# Patient Record
Sex: Female | Born: 2000 | Hispanic: Yes | Marital: Single | Smoking: Never smoker
Health system: Southern US, Community
[De-identification: ages and names within clinical notes are randomized; demographics above are authoritative.]

## PROBLEM LIST (undated history)

## (undated) DIAGNOSIS — F988 Other specified behavioral and emotional disorders with onset usually occurring in childhood and adolescence: Secondary | ICD-10-CM

## (undated) HISTORY — DX: Other specified behavioral and emotional disorders with onset usually occurring in childhood and adolescence: F98.8

## (undated) HISTORY — PX: WISDOM TOOTH EXTRACTION: SHX21

---
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
## 2018-02-06 ENCOUNTER — Encounter: Payer: Self-pay | Admitting: Emergency Medicine

## 2018-02-06 ENCOUNTER — Emergency Department
Admission: EM | Admit: 2018-02-06 | Discharge: 2018-02-06 | Disposition: A | Discharge status: Home / Self Care | Payer: No Typology Code available for payment source | Attending: Emergency Medicine | Admitting: Emergency Medicine

## 2018-02-06 ENCOUNTER — Other Ambulatory Visit: Payer: Self-pay

## 2018-02-06 ENCOUNTER — Emergency Department: Payer: No Typology Code available for payment source

## 2018-02-06 DIAGNOSIS — Z79899 Other long term (current) drug therapy: Secondary | ICD-10-CM | POA: Diagnosis not present

## 2018-02-06 DIAGNOSIS — Y9389 Activity, other specified: Secondary | ICD-10-CM | POA: Diagnosis not present

## 2018-02-06 DIAGNOSIS — Y999 Unspecified external cause status: Secondary | ICD-10-CM | POA: Diagnosis not present

## 2018-02-06 DIAGNOSIS — Y92410 Unspecified street and highway as the place of occurrence of the external cause: Secondary | ICD-10-CM | POA: Diagnosis not present

## 2018-02-06 DIAGNOSIS — S0990XA Unspecified injury of head, initial encounter: Secondary | ICD-10-CM | POA: Diagnosis present

## 2018-02-06 DIAGNOSIS — M542 Cervicalgia: Secondary | ICD-10-CM | POA: Insufficient documentation

## 2018-02-06 MED ORDER — CYCLOBENZAPRINE HCL 5 MG PO TABS: ORAL_TABLET | ORAL | 0 refills | Status: DC

## 2018-02-06 MED ORDER — IBUPROFEN 600 MG PO TABS: 600.0000 mg | ORAL_TABLET | Freq: Four times a day (QID) | ORAL | 0 refills | Status: DC | PRN
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
## 2018-02-12 ENCOUNTER — Ambulatory Visit
Admission: RE | Admit: 2018-02-12 | Discharge: 2018-02-12 | Disposition: A | Discharge status: Home / Self Care | Payer: No Typology Code available for payment source | Source: Ambulatory Visit | Attending: Pediatrics | Admitting: Pediatrics

## 2018-02-12 ENCOUNTER — Ambulatory Visit
Admission: RE | Admit: 2018-02-12 | Discharge: 2018-02-12 | Disposition: A | Discharge status: Home / Self Care | Payer: No Typology Code available for payment source | Source: Ambulatory Visit | Attending: Internal Medicine | Admitting: Internal Medicine

## 2018-02-12 ENCOUNTER — Other Ambulatory Visit: Payer: Self-pay | Admitting: Pediatrics

## 2018-02-12 DIAGNOSIS — M545 Low back pain: Secondary | ICD-10-CM | POA: Insufficient documentation

## 2018-02-12 DIAGNOSIS — M4184 Other forms of scoliosis, thoracic region: Secondary | ICD-10-CM | POA: Diagnosis not present

## 2018-02-12 DIAGNOSIS — M4186 Other forms of scoliosis, lumbar region: Secondary | ICD-10-CM | POA: Diagnosis not present

## 2018-02-12 DIAGNOSIS — R202 Paresthesia of skin: Secondary | ICD-10-CM | POA: Diagnosis not present

## 2018-09-26 ENCOUNTER — Ambulatory Visit: Payer: Self-pay | Admitting: Nurse Practitioner

## 2018-09-26 ENCOUNTER — Encounter: Payer: Self-pay | Admitting: Nurse Practitioner

## 2018-09-26 ENCOUNTER — Other Ambulatory Visit: Payer: Self-pay

## 2018-09-26 DIAGNOSIS — F988 Other specified behavioral and emotional disorders with onset usually occurring in childhood and adolescence: Secondary | ICD-10-CM

## 2018-09-26 MED ORDER — AMPHETAMINE-DEXTROAMPHET ER 30 MG PO CP24: 30.0000 mg | ORAL_CAPSULE | Freq: Every day | ORAL | 0 refills | Status: DC

## 2018-09-26 NOTE — Progress Notes (Signed)
[facility] [address] [place], [place] [id number]  Internal MEDICINE  Office Visit Note  Patient Name: Summer PrairieRaiza E Elsberry  2000  [id number]  Date of Service: 10/02/2018   Complaints/HPI Pt is here for establishment of PCP. Chief Complaint  Patient presents with  . ADD    New patient establish care   . Cyst    on the back of head, just noticed it no pain or discomfort with it    The patient is here to establish new primary care provider. Up until now, she has been seeing pediatrician. She has no health problems or concerns. She does currently take Adderall XR 30mg  daily to help with focus and concentration. She has been on this for several years. She does well with this medication. She is currently in college and gets good grades. She needs to have a new prescription for this today.    Current Medication: Outpatient Encounter Medications as of 09/26/2018  Medication Sig  . amphetamine-dextroamphetamine (ADDERALL XR) 30 MG 24 hr capsule Take 1 capsule (30 mg total) by mouth daily.  . [DISCONTINUED] amphetamine-dextroamphetamine (ADDERALL XR) 30 MG 24 hr capsule Take 30 mg by mouth daily.  . [DISCONTINUED] cyclobenzaprine (FLEXERIL) 5 MG tablet Take 1-2 tablets 3 times daily as needed (Patient not taking: Reported on 09/26/2018)  . [DISCONTINUED] ibuprofen (ADVIL,MOTRIN) 600 MG tablet Take 1 tablet (600 mg total) by mouth every 6 (six) hours as needed. (Patient not taking: Reported on 09/26/2018)   No facility-administered encounter medications on file as of 09/26/2018.     Surgical History: History reviewed. No pertinent surgical history.  Medical History: Past Medical History:  Diagnosis Date  . ADD (attention deficit disorder)     Family History: Family History  Problem Relation Age of Onset  . Hypertension Mother     Social History   Socioeconomic History  . Marital status: Single    Spouse name: Not on file  . Number of children: Not on file  . Years  of education: Not on file  . Highest education level: Not on file  Occupational History  . Not on file  Social Needs  . Financial resource strain: Not on file  . Food insecurity:    Worry: Not on file    Inability: Not on file  . Transportation needs:    Medical: Not on file    Non-medical: Not on file  Tobacco Use  . Smoking status: Never Smoker  . Smokeless tobacco: Never Used  Substance and Sexual Activity  . Alcohol use: Never    Frequency: Never  . Drug use: Never  . Sexual activity: Not on file  Lifestyle  . Physical activity:    Days per week: Not on file    Minutes per session: Not on file  . Stress: Not on file  Relationships  . Social connections:    Talks on phone: Not on file    Gets together: Not on file    Attends religious service: Not on file    Active member of club or organization: Not on file    Attends meetings of clubs or organizations: Not on file    Relationship status: Not on file  . Intimate partner violence:    Fear of current or ex partner: Not on file    Emotionally abused: Not on file    Physically abused: Not on file    Forced sexual activity: Not on file  Other Topics Concern  . Not on file  Social History Narrative  . Not on file     Review of Systems  Constitutional: Negative for chills, fatigue and unexpected weight change.  HENT: Negative for congestion, postnasal drip, rhinorrhea, sneezing and sore throat.   Respiratory: Negative for cough, chest tightness and shortness of breath.   Cardiovascular: Negative for chest pain and palpitations.  Gastrointestinal: Negative for abdominal pain, constipation, diarrhea, nausea and vomiting.  Endocrine: Negative for cold intolerance, heat intolerance, polydipsia and polyuria.  Musculoskeletal: Negative for arthralgias, back pain, joint swelling and neck pain.  Skin: Negative for rash.  Allergic/Immunologic: Negative for environmental allergies.  Neurological: Negative for dizziness,  tremors, numbness and headaches.  Hematological: Negative for adenopathy. Does not bruise/bleed easily.  Psychiatric/Behavioral: Positive for decreased concentration. Negative for behavioral problems (Depression), sleep disturbance and suicidal ideas. The patient is not nervous/anxious.     Today's Vitals   09/26/18 1501  BP: 116/62  Pulse: 90  Resp: 16  SpO2: 98%  Weight: 179 lb (81.2 kg)  Height: 5\' 5"  (1.651 m)   Body mass index is 29.79 kg/m.   Physical Exam Vitals signs and nursing note reviewed.  Constitutional:      General: She is not in acute distress.    Appearance: Normal appearance. She is well-developed. She is not diaphoretic.  HENT:     Head: Normocephalic and atraumatic.     Mouth/Throat:     Pharynx: No oropharyngeal exudate.  Eyes:     Pupils: Pupils are equal, round, and reactive to light.  Neck:     Musculoskeletal: Normal range of motion and neck supple.     Thyroid: No thyromegaly.     Vascular: No JVD.     Trachea: No tracheal deviation.  Cardiovascular:     Rate and Rhythm: Normal rate and regular rhythm.     Heart sounds: Normal heart sounds. No murmur. No friction rub. No gallop.   Pulmonary:     Effort: Pulmonary effort is normal. No respiratory distress.     Breath sounds: Normal breath sounds. No wheezing or rales.  Chest:     Chest wall: No tenderness.  Abdominal:     General: Bowel sounds are normal.     Palpations: Abdomen is soft.  Musculoskeletal: Normal range of motion.  Lymphadenopathy:     Cervical: No cervical adenopathy.  Skin:    General: Skin is warm and dry.  Neurological:     Mental Status: She is alert and oriented to person, place, and time.     Cranial Nerves: No cranial nerve deficit.  Psychiatric:        Behavior: Behavior normal.        Thought Content: Thought content normal.        Judgment: Judgment normal.   Assessment/Plan: 1. Attention deficit disorder (ADD) without hyperactivity Single prescription for  adderall XR 30mg  capsules sent to her pharmacy. Will get medical records from pediatrician to review.   General Counseling: Tamari verbalizes understanding of the findings of todays visit and agrees with plan of treatment. I have discussed any further diagnostic evaluation that may be needed or ordered today. We also reviewed her medications today. she has been encouraged to call the office with any questions or concerns that should arise related to todays visit.    Counseling:  Refilled Controlled medications today. Reviewed risks and possible side effects associated with taking Stimulants. Combination of these drugs with other psychotropic medications could cause dizziness and drowsiness. Pt needs to Monitor symptoms and exercise  caution in driving and operating heavy machinery to avoid damages to oneself, to others and to the surroundings. Patient verbalized understanding in this matter. Dependence and abuse for these drugs will be monitored closely. A Controlled substance policy and procedure is on file which allows [facility] to order a urine drug screen test at any visit. Patient understands and agrees with the plan..  This patient was seen by Vincent Gros FNP Collaboration with Dr Lyndon Code as a part of collaborative care agreement  Meds ordered this encounter  Medications  . amphetamine-dextroamphetamine (ADDERALL XR) 30 MG 24 hr capsule    Sig: Take 1 capsule (30 mg total) by mouth daily.    Dispense:  30 capsule    Refill:  0    Order Specific Question:   Supervising Provider    Answer:   Lyndon Code [[id number]]    Time spent: 25 Minutes

## 2018-10-02 ENCOUNTER — Encounter: Payer: Self-pay | Admitting: Nurse Practitioner

## 2018-10-02 DIAGNOSIS — F988 Other specified behavioral and emotional disorders with onset usually occurring in childhood and adolescence: Secondary | ICD-10-CM | POA: Insufficient documentation

## 2018-10-17 ENCOUNTER — Emergency Department: Payer: HRSA Program

## 2018-10-17 ENCOUNTER — Other Ambulatory Visit: Payer: Self-pay

## 2018-10-17 ENCOUNTER — Emergency Department
Admission: EM | Admit: 2018-10-17 | Discharge: 2018-10-17 | Disposition: A | Discharge status: Home / Self Care | Payer: HRSA Program | Attending: Emergency Medicine | Admitting: Emergency Medicine

## 2018-10-17 ENCOUNTER — Encounter: Payer: Self-pay | Admitting: Intensive Care

## 2018-10-17 DIAGNOSIS — J189 Pneumonia, unspecified organism: Secondary | ICD-10-CM | POA: Diagnosis not present

## 2018-10-17 DIAGNOSIS — R509 Fever, unspecified: Secondary | ICD-10-CM | POA: Diagnosis present

## 2018-10-17 DIAGNOSIS — R0602 Shortness of breath: Secondary | ICD-10-CM

## 2018-10-17 DIAGNOSIS — Z20828 Contact with and (suspected) exposure to other viral communicable diseases: Secondary | ICD-10-CM | POA: Diagnosis not present

## 2018-10-17 DIAGNOSIS — Z79899 Other long term (current) drug therapy: Secondary | ICD-10-CM | POA: Diagnosis not present

## 2018-10-17 LAB — CBC
HCT: 38.3 % (ref 36.0–46.0)
Hemoglobin: 12.6 g/dL (ref 12.0–15.0)
MCH: 29.7 pg (ref 26.0–34.0)
MCHC: 32.9 g/dL (ref 30.0–36.0)
MCV: 90.3 fL (ref 80.0–100.0)
Platelets: 185 10*3/uL (ref 150–400)
RBC: 4.24 MIL/uL (ref 3.87–5.11)
RDW: 12.4 % (ref 11.5–15.5)
WBC: 5.4 10*3/uL (ref 4.0–10.5)
nRBC: 0 % (ref 0.0–0.2)

## 2018-10-17 LAB — BASIC METABOLIC PANEL
Anion gap: 8 (ref 5–15)
BUN: 8 mg/dL (ref 6–20)
CO2: 25 mmol/L (ref 22–32)
Calcium: 8.9 mg/dL (ref 8.9–10.3)
Chloride: 103 mmol/L (ref 98–111)
Creatinine, Ser: 0.54 mg/dL (ref 0.44–1.00)
GFR calc Af Amer: >60 mL/min (ref >60)
GFR calc non Af Amer: >60 mL/min (ref >60)
Glucose, Bld: 100 mg/dL — ABNORMAL HIGH (ref 70–99)
Potassium: 3.7 mmol/L (ref 3.5–5.1)
Sodium: 136 mmol/L (ref 135–145)

## 2018-10-17 LAB — CBC WITH DIFFERENTIAL/PLATELET
Abs Immature Granulocytes: 0.01 10*3/uL (ref 0.00–0.07)
Basophils Absolute: 0 10*3/uL (ref 0.0–0.1)
Basophils Relative: 0 %
Eosinophils Absolute: 0 10*3/uL (ref 0.0–0.5)
Eosinophils Relative: 0 %
HCT: 38.4 % (ref 36.0–46.0)
Hemoglobin: 12.7 g/dL (ref 12.0–15.0)
Immature Granulocytes: 0 %
Lymphocytes Relative: 26 %
Lymphs Abs: 1.4 10*3/uL (ref 0.7–4.0)
MCH: 30 pg (ref 26.0–34.0)
MCHC: 33.1 g/dL (ref 30.0–36.0)
MCV: 90.8 fL (ref 80.0–100.0)
Monocytes Absolute: 0.5 10*3/uL (ref 0.1–1.0)
Monocytes Relative: 10 %
Neutro Abs: 3.5 10*3/uL (ref 1.7–7.7)
Neutrophils Relative %: 64 %
Platelets: 189 10*3/uL (ref 150–400)
RBC: 4.23 MIL/uL (ref 3.87–5.11)
RDW: 12.4 % (ref 11.5–15.5)
WBC: 5.4 10*3/uL (ref 4.0–10.5)
nRBC: 0 % (ref 0.0–0.2)

## 2018-10-17 LAB — TROPONIN I (HIGH SENSITIVITY): Troponin I (High Sensitivity): <2 ng/L (ref <18)

## 2018-10-17 LAB — POCT PREGNANCY, URINE: Preg Test, Ur: NEGATIVE

## 2018-10-17 MED ORDER — ACETAMINOPHEN 325 MG PO TABS
650.0000 mg | ORAL_TABLET | Freq: Once | ORAL | Status: AC | PRN
  Administered 2018-10-17: 650 mg via ORAL
  Filled 2018-10-17: qty 2

## 2018-10-17 MED ORDER — AZITHROMYCIN 250 MG PO TABS: ORAL_TABLET | ORAL | 0 refills | Status: AC

## 2018-10-17 MED ORDER — AZITHROMYCIN 500 MG PO TABS
500.0000 mg | ORAL_TABLET | Freq: Once | ORAL | Status: AC
  Administered 2018-10-17: 500 mg via ORAL
  Filled 2018-10-17: qty 1

## 2018-10-17 NOTE — ED Triage Notes (Signed)
PAtient c/o sob, cough, fever, and fatigue. Patient crying during triage. Fever 102.1 upon arrival

## 2018-10-17 NOTE — ED Provider Notes (Signed)
[facility] Emergency Department Provider Note   ____________________________________________   First MD Initiated Contact with Patient 10/17/18 2014     (approximate)  I have reviewed the triage vital signs and the nursing notes.   HISTORY  Chief Complaint Fever, Fatigue, Cough, and Shortness of Breath    HPI Summer Webb is a 18 y.o. female complains of cough fever feeling ill.  1 of her family members has been tested positive for COVID.  Her cough is nonproductive.  Fever 102 here.  She does not have any sore throat belly pain dysuria or other complaints.       Past Medical History:  Diagnosis Date  . ADD (attention deficit disorder)     Patient Active Problem List   Diagnosis Date Noted  . ADD (attention deficit disorder) 10/02/2018  . Cardiac murmur 08/23/2016  . Fibrocystic breast changes 12/13/2012    History reviewed. No pertinent surgical history.  Prior to Admission medications   Medication Sig Start Date End Date Taking? Authorizing Provider  amphetamine-dextroamphetamine (ADDERALL XR) 30 MG 24 hr capsule Take 1 capsule (30 mg total) by mouth daily. 09/26/18   Ronnell Freshwater, NP  azithromycin (ZITHROMAX Z-PAK) 250 MG tablet Take 2 tablets (500 mg) on  Day 1,  followed by 1 tablet (250 mg) once daily on Days 2 through 5. 10/17/18 10/22/18  Nena Polio, MD    Allergies Patient has no known allergies.  Family History  Problem Relation Age of Onset  . Hypertension Mother     Social History Social History   Tobacco Use  . Smoking status: Never Smoker  . Smokeless tobacco: Never Used  Substance Use Topics  . Alcohol use: Never    Frequency: Never  . Drug use: Never    Review of Systems  Constitutional:  fever/chills Eyes: No visual changes. ENT: No sore throat. Cardiovascular: Denies chest pain. Respiratory: Denies shortness of breath. Gastrointestinal: No abdominal pain.  No nausea, no vomiting.  No diarrhea.   No constipation. Genitourinary: Negative for dysuria. Musculoskeletal: Negative for back pain. Skin: Negative for rash. Neurological: Negative for headaches, focal weakness   ____________________________________________   PHYSICAL EXAM:  VITAL SIGNS: ED Triage Vitals  Enc Vitals Group     BP 10/17/18 1803 (!) 149/92     Pulse Rate 10/17/18 1803 100     Resp 10/17/18 1803 20     Temp 10/17/18 1803 (!) 102.1 F (38.9 C)     Temp Source 10/17/18 1803 Oral     SpO2 10/17/18 1803 98 %     Weight 10/17/18 1804 174 lb (78.9 kg)     Height 10/17/18 1804 5\' 5"  (1.651 m)     Head Circumference --      Peak Flow --      Pain Score 10/17/18 1804 7     Pain Loc --      Pain Edu? --      Excl. in [place]? --    Constitutional: Alert and oriented. Well appearing and in no acute distress. Eyes: Conjunctivae are normal.  Head: Atraumatic. Nose: No congestion/rhinnorhea. Mouth/Throat: Mucous membranes are moist.  Oropharynx non-erythematous. Neck: No stridor.  Cardiovascular: Normal rate, regular rhythm. Grossly normal heart sounds.  Good peripheral circulation. Respiratory: Normal respiratory effort.  No retractions. Lungs CTAB.  Patient is coughing a lot.  The cough is dry. Gastrointestinal: Soft and nontender. No distention. No abdominal bruits.  Musculoskeletal: No lower extremity tenderness nor edema.  Neurologic:  Normal speech and language. No gross focal neurologic deficits are appreciated. . Skin:  Skin is warm, dry and intact. No rash noted.  ____________________________________________   LABS (all labs ordered are listed, but only abnormal results are displayed)  Labs Reviewed  BASIC METABOLIC PANEL - Abnormal; Notable for the following components:      Result Value   Glucose, Bld 100 (*)    All other components within normal limits  NOVEL CORONAVIRUS, NAA (HOSPITAL ORDER, SEND-OUT TO REF LAB)  CBC  TROPONIN I (HIGH SENSITIVITY)  CBC WITH DIFFERENTIAL/PLATELET  POC  URINE PREG, ED  POCT PREGNANCY, URINE   ____________________________________________  EKG  KG read interpreted by me shows sinus tachycardia rate of 112 normal axis nonspecific ST-T wave changes ____________________________________________  RADIOLOGY  ED MD interpretation  Official radiology report(s): Dg Chest 1 View  Result Date: 10/17/2018 CLINICAL DATA:  Shortness of breath, cough and fever EXAM: CHEST  1 VIEW COMPARISON:  None. FINDINGS: There is opacity within the lower right lung likely indicating early consolidation. No pleural effusion or pneumothorax. Left lung is clear. Normal cardiomediastinal contours. IMPRESSION: Lower right lung opacity, likely pneumonia. Electronically Signed   By: Deatra RobinsonKevin  Herman M.D.   On: 10/17/2018 20:53    ____________________________________________   PROCEDURES  Procedure(s) performed (including Critical Care):  Procedures   ____________________________________________   INITIAL IMPRESSION / ASSESSMENT AND PLAN / ED COURSE  Coronavirus test is negative.  The chest x-ray does show a right lower lobe hazy area consistent with pneumonia.  The patient is not hypoxic.  White count is okay.  We will get her some Zithromax and have her return for any further problems.              ____________________________________________   FINAL CLINICAL IMPRESSION(S) / ED DIAGNOSES  Final diagnoses:  Community acquired pneumonia, unspecified laterality     ED Discharge Orders         Ordered    azithromycin (ZITHROMAX Z-PAK) 250 MG tablet     10/17/18 2144           Note:  This document was prepared using Dragon voice recognition software and may include unintentional dictation errors.    Arnaldo NatalMalinda, Kimara Bencomo F, MD 10/17/18 2145

## 2018-10-17 NOTE — ED Notes (Signed)
Esign not working at this time. Pt verbalized discharge instructions and has no questions at this time. 

## 2018-10-17 NOTE — Discharge Instructions (Addendum)
Chest x-ray shows a pneumonia.  Your coronavirus test is negative.  He might have just a regular pneumonia.  I will give you some Zithromax antibiotic.  We will give you a dose here tonight and then another prescription to start tomorrow.  Can start a tomorrow afternoon that will be fine.  Please return here if you get increasingly short of breath feel sicker or have any other problems.

## 2018-10-19 LAB — NOVEL CORONAVIRUS, NAA (HOSP ORDER, SEND-OUT TO REF LAB; TAT 18-24 HRS): SARS-CoV-2, NAA: NOT DETECTED

## 2018-10-23 ENCOUNTER — Telehealth: Payer: Self-pay | Admitting: Emergency Medicine

## 2018-10-23 NOTE — Telephone Encounter (Signed)
Called patient to inform of negative covid 19 test with [facility] interpretter.  No answer and voicemail is full.

## 2018-10-28 ENCOUNTER — Ambulatory Visit: Payer: Medicaid Other | Admitting: Nurse Practitioner

## 2018-11-20 ENCOUNTER — Ambulatory Visit: Payer: Self-pay | Admitting: Adult Health

## 2018-11-20 ENCOUNTER — Encounter: Payer: Self-pay | Admitting: Adult Health

## 2018-11-20 ENCOUNTER — Other Ambulatory Visit: Payer: Self-pay

## 2018-11-20 VITALS — Temp 98.6°F

## 2018-11-20 DIAGNOSIS — F988 Other specified behavioral and emotional disorders with onset usually occurring in childhood and adolescence: Secondary | ICD-10-CM

## 2018-11-20 DIAGNOSIS — R059 Cough, unspecified: Secondary | ICD-10-CM

## 2018-11-20 DIAGNOSIS — R05 Cough: Secondary | ICD-10-CM

## 2018-11-20 NOTE — Progress Notes (Signed)
[facility] [address] [place], [place] [address]  Internal MEDICINE  Telephone Visit  Patient Name: Summer Webb  [birth year]  [id number]  Date of Service: 11/20/2018  I connected with the patient at  1205 by telephone and verified the patients identity using two identifiers.  I discussed the limitations, risks, security and privacy concerns of performing an evaluation and management service by telephone and the availability of in person appointments. I also discussed with the patient that there may be a patient responsible charge related to the service.  The patient expressed understanding and agrees to proceed.    Chief Complaint  Patient presents with  . Telephone Screen  . Pneumonia  . Cough    coughing up clear phlegm, pt states at times she feels like she can not catch her breathe   . Telephone Assessment    HPI  PT reports she was diagnosed with pneumonia about one month ago. She was treated with a z-pak and reports she has gotten better, although not 100% better. She continues to have intermittent cough, and reports productive cough in the morning when she wakes up.     Current Medication: Outpatient Encounter Medications as of 11/20/2018  Medication Sig  . amphetamine-dextroamphetamine (ADDERALL XR) 30 MG 24 hr capsule Take 1 capsule (30 mg total) by mouth daily.   No facility-administered encounter medications on file as of 11/20/2018.     Surgical History: History reviewed. No pertinent surgical history.  Medical History: Past Medical History:  Diagnosis Date  . ADD (attention deficit disorder)     Family History: Family History  Problem Relation Age of Onset  . Hypertension Mother     Social History   Socioeconomic History  . Marital status: Single    Spouse name: Not on file  . Number of children: Not on file  . Years of education: Not on file  . Highest education level: Not on file  Occupational History  . Not on file  Social  Needs  . Financial resource strain: Not on file  . Food insecurity    Worry: Not on file    Inability: Not on file  . Transportation needs    Medical: Not on file    Non-medical: Not on file  Tobacco Use  . Smoking status: Never Smoker  . Smokeless tobacco: Never Used  Substance and Sexual Activity  . Alcohol use: Never    Frequency: Never  . Drug use: Never  . Sexual activity: Not on file  Lifestyle  . Physical activity    Days per week: Not on file    Minutes per session: Not on file  . Stress: Not on file  Relationships  . Social Musicianconnections    Talks on phone: Not on file    Gets together: Not on file    Attends religious service: Not on file    Active member of club or organization: Not on file    Attends meetings of clubs or organizations: Not on file    Relationship status: Not on file  . Intimate partner violence    Fear of current or ex partner: Not on file    Emotionally abused: Not on file    Physically abused: Not on file    Forced sexual activity: Not on file  Other Topics Concern  . Not on file  Social History Narrative  . Not on file      Review of Systems  Constitutional: Negative for chills, fatigue and  unexpected weight change.  HENT: Negative for congestion, rhinorrhea, sneezing and sore throat.   Eyes: Negative for photophobia, pain and redness.  Respiratory: Negative for cough, chest tightness and shortness of breath.   Cardiovascular: Negative for chest pain and palpitations.  Gastrointestinal: Negative for abdominal pain, constipation, diarrhea, nausea and vomiting.  Endocrine: Negative.   Genitourinary: Negative for dysuria and frequency.  Musculoskeletal: Negative for arthralgias, back pain, joint swelling and neck pain.  Skin: Negative for rash.  Allergic/Immunologic: Negative.   Neurological: Negative for tremors and numbness.  Hematological: Negative for adenopathy. Does not bruise/bleed easily.  Psychiatric/Behavioral: Negative for  behavioral problems and sleep disturbance. The patient is not nervous/anxious.     Vital Signs: Temp 98.6 F (37 C)    Observation/Objective:  Well appearing, NAD noted at this time.    Assessment/Plan: 1. Cough Her pneumonia from her hospital admission appears to have resolved on Chest x ray. Most likely her cough is due to allergies, and sinus drainage. Pt instructed to use daily allergy medication and return to clinic if symptoms fail to improve.   - DG Chest 2 View; Future  2. Attention deficit disorder (ADD) without hyperactivity Stable, continue present management.   General Counseling: Summer Webb verbalizes understanding of the findings of today's phone visit and agrees with plan of treatment. I have discussed any further diagnostic evaluation that may be needed or ordered today. We also reviewed her medications today. she has been encouraged to call the office with any questions or concerns that should arise related to todays visit.    No orders of the defined types were placed in this encounter.   No orders of the defined types were placed in this encounter.   Time spent: [facility] Internal medicine

## 2018-11-21 ENCOUNTER — Telehealth: Payer: Self-pay | Admitting: Adult Health

## 2018-11-21 ENCOUNTER — Ambulatory Visit
Admission: RE | Admit: 2018-11-21 | Discharge: 2018-11-21 | Disposition: A | Discharge status: Home / Self Care | Payer: Self-pay | Attending: Adult Health | Admitting: Adult Health

## 2018-11-21 ENCOUNTER — Ambulatory Visit
Admission: RE | Admit: 2018-11-21 | Discharge: 2018-11-21 | Disposition: A | Discharge status: Home / Self Care | Payer: Self-pay | Source: Ambulatory Visit | Attending: Adult Health | Admitting: Adult Health

## 2018-11-21 ENCOUNTER — Other Ambulatory Visit: Payer: Self-pay

## 2018-11-21 DIAGNOSIS — R05 Cough: Secondary | ICD-10-CM | POA: Insufficient documentation

## 2018-11-21 DIAGNOSIS — R059 Cough, unspecified: Secondary | ICD-10-CM

## 2018-11-21 NOTE — Telephone Encounter (Signed)
Tried to contact patient regarding xray, it is clear, unable to leave message voicemail full

## 2018-12-16 ENCOUNTER — Encounter: Payer: Self-pay | Admitting: Nurse Practitioner

## 2018-12-16 ENCOUNTER — Ambulatory Visit: Payer: BC Managed Care – PPO | Admitting: Nurse Practitioner

## 2018-12-16 ENCOUNTER — Other Ambulatory Visit: Payer: Self-pay

## 2018-12-16 VITALS — BP 130/78 | Ht 66.0 in | Wt 174.0 lb

## 2018-12-16 DIAGNOSIS — F988 Other specified behavioral and emotional disorders with onset usually occurring in childhood and adolescence: Secondary | ICD-10-CM | POA: Diagnosis not present

## 2018-12-16 MED ORDER — AMPHETAMINE-DEXTROAMPHET ER 30 MG PO CP24: 30.0000 mg | ORAL_CAPSULE | Freq: Every day | ORAL | 0 refills | Status: DC

## 2018-12-16 NOTE — Progress Notes (Signed)
[facility] [address] [place], [place] [address]  Internal MEDICINE  Telephone Visit  Patient Name: Summer Webb  [id number]/2000  [id number]  Date of Service: 12/17/2018  I connected with the patient at 5:10pm by webcam and verified the patients identity using two identifiers.   I discussed the limitations, risks, security and privacy concerns of performing an evaluation and management service by webcam and the availability of in person appointments. I also discussed with the patient that there may be a patient responsible charge related to the service.  The patient expressed understanding and agrees to proceed.    Chief Complaint  Patient presents with  . Telephone Assessment  . Telephone Screen  . ADD  . Medication Refill    needs refill on adderall    The patient has been contacted via webcam for follow up visit due to concerns for spread of novel coronavirus. She is currently taking Adderall XR 30mg  daily when needed. She takes this on days when she goes to school abd work. She has been on this medication at this dose for some time. She does well with it and has no negative side effects. She needs to have refills for this today.       Current Medication: Outpatient Encounter Medications as of 12/16/2018  Medication Sig  . amphetamine-dextroamphetamine (ADDERALL XR) 30 MG 24 hr capsule Take 1 capsule (30 mg total) by mouth daily.  . [DISCONTINUED] amphetamine-dextroamphetamine (ADDERALL XR) 30 MG 24 hr capsule Take 1 capsule (30 mg total) by mouth daily.  . [DISCONTINUED] amphetamine-dextroamphetamine (ADDERALL XR) 30 MG 24 hr capsule Take 1 capsule (30 mg total) by mouth daily.  . [DISCONTINUED] amphetamine-dextroamphetamine (ADDERALL XR) 30 MG 24 hr capsule Take 1 capsule (30 mg total) by mouth daily.   No facility-administered encounter medications on file as of 12/16/2018.     Surgical History: History reviewed. No pertinent surgical history.  Medical  History: Past Medical History:  Diagnosis Date  . ADD (attention deficit disorder)     Family History: Family History  Problem Relation Age of Onset  . Hypertension Mother     Social History   Socioeconomic History  . Marital status: Single    Spouse name: Not on file  . Number of children: Not on file  . Years of education: Not on file  . Highest education level: Not on file  Occupational History  . Not on file  Social Needs  . Financial resource strain: Not on file  . Food insecurity    Worry: Not on file    Inability: Not on file  . Transportation needs    Medical: Not on file    Non-medical: Not on file  Tobacco Use  . Smoking status: Never Smoker  . Smokeless tobacco: Never Used  Substance and Sexual Activity  . Alcohol use: Never    Frequency: Never  . Drug use: Never  . Sexual activity: Not on file  Lifestyle  . Physical activity    Days per week: Not on file    Minutes per session: Not on file  . Stress: Not on file  Relationships  . Social Musicianconnections    Talks on phone: Not on file    Gets together: Not on file    Attends religious service: Not on file    Active member of club or organization: Not on file    Attends meetings of clubs or organizations: Not on file    Relationship status: Not on file  .  Intimate partner violence    Fear of current or ex partner: Not on file    Emotionally abused: Not on file    Physically abused: Not on file    Forced sexual activity: Not on file  Other Topics Concern  . Not on file  Social History Narrative  . Not on file      Review of Systems  Constitutional: Negative for activity change, chills, fatigue and unexpected weight change.  HENT: Negative for congestion, postnasal drip, rhinorrhea, sneezing and sore throat.   Respiratory: Negative for cough, chest tightness and shortness of breath.   Cardiovascular: Negative for chest pain and palpitations.  Gastrointestinal: Negative for abdominal pain,  constipation, diarrhea, nausea and vomiting.  Endocrine: Negative for cold intolerance, heat intolerance, polydipsia and polyuria.  Musculoskeletal: Negative for arthralgias, back pain, joint swelling and neck pain.  Skin: Negative for rash.  Allergic/Immunologic: Negative for environmental allergies.  Neurological: Negative for dizziness, tremors, numbness and headaches.  Hematological: Negative for adenopathy. Does not bruise/bleed easily.  Psychiatric/Behavioral: Positive for decreased concentration. Negative for behavioral problems (Depression), sleep disturbance and suicidal ideas. The patient is not nervous/anxious.     Today's Vitals   12/16/18 1630  BP: 130/78  Weight: 174 lb (78.9 kg)  Height: 5\' 6"  (1.676 m)   Body mass index is 28.08 kg/m.  Observation/Objective:   The patient is alert and oriented. She is pleasant and answers all questions appropriately. Breathing is non-labored. She is in no acute distress at this time.    Assessment/Plan:  1. Attention deficit disorder (ADD) without hyperactivity May continue with adderall XR 30mg  daily. Three 30 day prescriptions sent to her pharmacy. Dates are 12/16/2018, 01/14/2019, and 02/11/2019 - amphetamine-dextroamphetamine (ADDERALL XR) 30 MG 24 hr capsule; Take 1 capsule (30 mg total) by mouth daily.  Dispense: 30 capsule; Refill: 0  General Counseling: Summer Webb verbalizes understanding of the findings of today's phone visit and agrees with plan of treatment. I have discussed any further diagnostic evaluation that may be needed or ordered today. We also reviewed her medications today. she has been encouraged to call the office with any questions or concerns that should arise related to todays visit.   Refilled Controlled medications today. Reviewed risks and possible side effects associated with taking Stimulants. Combination of these drugs with other psychotropic medications could cause dizziness and drowsiness. Pt needs to  Monitor symptoms and exercise caution in driving and operating heavy machinery to avoid damages to oneself, to others and to the surroundings. Patient verbalized understanding in this matter. Dependence and abuse for these drugs will be monitored closely. A Controlled substance policy and procedure is on file which allows [facility] to order a urine drug screen test at any visit. Patient understands and agrees with the plan..  This patient was seen by Leretha Pol FNP Collaboration with Dr Lavera Guise as a part of collaborative care agreement  Meds ordered this encounter  Medications  . DISCONTD: amphetamine-dextroamphetamine (ADDERALL XR) 30 MG 24 hr capsule    Sig: Take 1 capsule (30 mg total) by mouth daily.    Dispense:  30 capsule    Refill:  0    Order Specific Question:   Supervising Provider    Answer:   Lavera Guise [[id number]]  . DISCONTD: amphetamine-dextroamphetamine (ADDERALL XR) 30 MG 24 hr capsule    Sig: Take 1 capsule (30 mg total) by mouth daily.    Dispense:  30 capsule    Refill:  0  Fill after 01/14/2019    Order Specific Question:   Supervising Provider    Answer:   Lyndon CodeKHAN, FOZIA M [[id number]]  . amphetamine-dextroamphetamine (ADDERALL XR) 30 MG 24 hr capsule    Sig: Take 1 capsule (30 mg total) by mouth daily.    Dispense:  30 capsule    Refill:  0    Fill after 02/11/2019    Order Specific Question:   Supervising Provider    Answer:   Lyndon CodeKHAN, FOZIA M [[id number]]    Time spent: 9015 Minutes    Dr Lyndon CodeFozia M Khan Internal medicine

## 2019-02-06 ENCOUNTER — Ambulatory Visit: Payer: BC Managed Care – PPO | Admitting: Nurse Practitioner

## 2019-04-10 ENCOUNTER — Telehealth: Payer: Self-pay

## 2019-04-10 NOTE — Telephone Encounter (Signed)
CONFIRMED AND SCREENED FOR 04-14-19. 

## 2019-04-14 ENCOUNTER — Ambulatory Visit: Payer: Medicaid Other | Admitting: Nurse Practitioner

## 2019-04-21 ENCOUNTER — Telehealth: Payer: Self-pay

## 2019-04-21 NOTE — Telephone Encounter (Signed)
PT SCREENED AND CONFIRMED 05-24-18 OV.

## 2019-04-24 ENCOUNTER — Encounter (INDEPENDENT_AMBULATORY_CARE_PROVIDER_SITE_OTHER): Payer: Self-pay

## 2019-04-24 ENCOUNTER — Encounter: Payer: Self-pay | Admitting: Nurse Practitioner

## 2019-04-24 ENCOUNTER — Other Ambulatory Visit: Payer: Self-pay

## 2019-04-24 ENCOUNTER — Ambulatory Visit: Payer: BC Managed Care – PPO | Admitting: Nurse Practitioner

## 2019-04-24 VITALS — BP 126/76 | HR 98 | Temp 97.8°F | Resp 16 | Ht 66.0 in | Wt 181.0 lb

## 2019-04-24 DIAGNOSIS — F988 Other specified behavioral and emotional disorders with onset usually occurring in childhood and adolescence: Secondary | ICD-10-CM

## 2019-04-24 DIAGNOSIS — K5909 Other constipation: Secondary | ICD-10-CM | POA: Diagnosis not present

## 2019-04-24 MED ORDER — AMPHETAMINE-DEXTROAMPHET ER 30 MG PO CP24: 30.0000 mg | ORAL_CAPSULE | Freq: Every day | ORAL | 0 refills | Status: DC

## 2019-04-24 NOTE — Progress Notes (Signed)
[facility] [address] [place], [place] [address]  Internal MEDICINE  Office Visit Note  Patient Name: Summer Webb  [id number]-Mar-2002  [id number]  Date of Service: 04/30/2019  Chief Complaint  Patient presents with  . ADD    The patient is here for routine follow up visit. She is currently taking Adderall XR 30mg  daily when needed. She takes this on days when she goes to school abd work. She has been on this medication at this dose for some time. She does well with it. She has noted some mild constipation with the medication. She is able to take gentle stool softener and increase water intake to improve this.       Current Medication: Outpatient Encounter Medications as of 04/24/2019  Medication Sig  . amphetamine-dextroamphetamine (ADDERALL XR) 30 MG 24 hr capsule Take 1 capsule (30 mg total) by mouth daily.  . [DISCONTINUED] amphetamine-dextroamphetamine (ADDERALL XR) 30 MG 24 hr capsule Take 1 capsule (30 mg total) by mouth daily.  . [DISCONTINUED] amphetamine-dextroamphetamine (ADDERALL XR) 30 MG 24 hr capsule Take 1 capsule (30 mg total) by mouth daily.  . [DISCONTINUED] amphetamine-dextroamphetamine (ADDERALL XR) 30 MG 24 hr capsule Take 1 capsule (30 mg total) by mouth daily.   No facility-administered encounter medications on file as of 04/24/2019.    Surgical History: History reviewed. No pertinent surgical history.  Medical History: Past Medical History:  Diagnosis Date  . ADD (attention deficit disorder)     Family History: Family History  Problem Relation Age of Onset  . Hypertension Mother     Social History   Socioeconomic History  . Marital status: Single    Spouse name: Not on file  . Number of children: Not on file  . Years of education: Not on file  . Highest education level: Not on file  Occupational History  . Not on file  Tobacco Use  . Smoking status: Never Smoker  . Smokeless tobacco: Never Used  Substance and Sexual Activity   . Alcohol use: Never  . Drug use: Never  . Sexual activity: Not on file  Other Topics Concern  . Not on file  Social History Narrative  . Not on file   Social Determinants of Health   Financial Resource Strain:   . Difficulty of Paying Living Expenses: Not on file  Food Insecurity:   . Worried About Programme researcher, broadcasting/film/videounning Out of Food in the Last Year: Not on file  . Ran Out of Food in the Last Year: Not on file  Transportation Needs:   . Lack of Transportation (Medical): Not on file  . Lack of Transportation (Non-Medical): Not on file  Physical Activity:   . Days of Exercise per Week: Not on file  . Minutes of Exercise per Session: Not on file  Stress:   . Feeling of Stress : Not on file  Social Connections:   . Frequency of Communication with Friends and Family: Not on file  . Frequency of Social Gatherings with Friends and Family: Not on file  . Attends Religious Services: Not on file  . Active Member of Clubs or Organizations: Not on file  . Attends BankerClub or Organization Meetings: Not on file  . Marital Status: Not on file  Intimate Partner Violence:   . Fear of Current or Ex-Partner: Not on file  . Emotionally Abused: Not on file  . Physically Abused: Not on file  . Sexually Abused: Not on file      Review of Systems  Constitutional: Negative for activity change, chills, fatigue and unexpected weight change.  HENT: Negative for congestion, postnasal drip, rhinorrhea, sneezing and sore throat.   Respiratory: Negative for cough, chest tightness and shortness of breath.   Cardiovascular: Negative for chest pain and palpitations.  Gastrointestinal: Positive for constipation. Negative for abdominal pain, diarrhea, nausea and vomiting.  Endocrine: Negative for cold intolerance, heat intolerance, polydipsia and polyuria.  Musculoskeletal: Negative for arthralgias, back pain, joint swelling and neck pain.  Skin: Negative for rash.  Allergic/Immunologic: Negative for environmental  allergies.  Neurological: Negative for dizziness, tremors, numbness and headaches.  Hematological: Negative for adenopathy. Does not bruise/bleed easily.  Psychiatric/Behavioral: Positive for decreased concentration. Negative for behavioral problems (Depression), sleep disturbance and suicidal ideas. The patient is not nervous/anxious.     Today's Vitals   04/24/19 1009  BP: 126/76  Pulse: 98  Resp: 16  Temp: 97.8 F (36.6 C)  SpO2: 97%  Weight: 181 lb (82.1 kg)  Height: 5\' 6"  (1.676 m)   Body mass index is 29.21 kg/m.  Physical Exam Vitals and nursing note reviewed.  Constitutional:      General: She is not in acute distress.    Appearance: Normal appearance. She is well-developed. She is not diaphoretic.  HENT:     Head: Normocephalic and atraumatic.     Mouth/Throat:     Pharynx: No oropharyngeal exudate.  Eyes:     Pupils: Pupils are equal, round, and reactive to light.  Neck:     Thyroid: No thyromegaly.     Vascular: No JVD.     Trachea: No tracheal deviation.  Cardiovascular:     Rate and Rhythm: Normal rate and regular rhythm.     Heart sounds: Normal heart sounds. No murmur. No friction rub. No gallop.   Pulmonary:     Effort: Pulmonary effort is normal. No respiratory distress.     Breath sounds: Normal breath sounds. No wheezing or rales.  Chest:     Chest wall: No tenderness.  Abdominal:     General: Bowel sounds are normal.     Palpations: Abdomen is soft.     Tenderness: There is no abdominal tenderness.  Musculoskeletal:        General: Normal range of motion.     Cervical back: Normal range of motion and neck supple.  Lymphadenopathy:     Cervical: No cervical adenopathy.  Skin:    General: Skin is warm and dry.  Neurological:     Mental Status: She is alert and oriented to person, place, and time.     Cranial Nerves: No cranial nerve deficit.  Psychiatric:        Mood and Affect: Mood normal.        Behavior: Behavior normal.        Thought  Content: Thought content normal.        Judgment: Judgment normal.    Assessment/Plan: 1. Other constipation Likely side effect from taking adderall. She should continue to take stool softener as needed. Encouraged her to get proper amount of water and fiber in the diet.   2. Attention deficit disorder (ADD) without hyperactivity May continue Adderall XR 30mg  daily if needed. Three 30 day prescriptions provided. Dates are 04/24/2019, 05/23/2019, and 06/21/2019 - amphetamine-dextroamphetamine (ADDERALL XR) 30 MG 24 hr capsule; Take 1 capsule (30 mg total) by mouth daily.  Dispense: 30 capsule; Refill: 0  General Counseling: Summer Webb verbalizes understanding of the findings of todays visit and agrees with plan of  treatment. I have discussed any further diagnostic evaluation that may be needed or ordered today. We also reviewed her medications today. she has been encouraged to call the office with any questions or concerns that should arise related to todays visit.  Refilled Controlled medications today. Reviewed risks and possible side effects associated with taking Stimulants. Combination of these drugs with other psychotropic medications could cause dizziness and drowsiness. Pt needs to Monitor symptoms and exercise caution in driving and operating heavy machinery to avoid damages to oneself, to others and to the surroundings. Patient verbalized understanding in this matter. Dependence and abuse for these drugs will be monitored closely. A Controlled substance policy and procedure is on file which allows [facility] to order a urine drug screen test at any visit. Patient understands and agrees with the plan..  This patient was seen by Leretha Pol FNP Collaboration with Dr Lavera Guise as a part of collaborative care agreement  Meds ordered this encounter  Medications  . DISCONTD: amphetamine-dextroamphetamine (ADDERALL XR) 30 MG 24 hr capsule    Sig: Take 1 capsule (30 mg total) by  mouth daily.    Dispense:  30 capsule    Refill:  0    Order Specific Question:   Supervising Provider    Answer:   Lavera Guise [[id number]]  . DISCONTD: amphetamine-dextroamphetamine (ADDERALL XR) 30 MG 24 hr capsule    Sig: Take 1 capsule (30 mg total) by mouth daily.    Dispense:  30 capsule    Refill:  0    Fill after 05/23/2019    Order Specific Question:   Supervising Provider    Answer:   Lavera Guise [[id number]]  . amphetamine-dextroamphetamine (ADDERALL XR) 30 MG 24 hr capsule    Sig: Take 1 capsule (30 mg total) by mouth daily.    Dispense:  30 capsule    Refill:  0    Fill after 06/22/2019    Order Specific Question:   Supervising Provider    Answer:   Lavera Guise [[id number]]    Time spent: 20 Minutes      Dr Lavera Guise Internal medicine

## 2019-04-30 DIAGNOSIS — K5909 Other constipation: Secondary | ICD-10-CM | POA: Insufficient documentation

## 2019-05-09 ENCOUNTER — Telehealth: Payer: Self-pay

## 2019-05-09 NOTE — Telephone Encounter (Signed)
Confirmed appointment with patient. klh °

## 2019-05-12 ENCOUNTER — Ambulatory Visit (INDEPENDENT_AMBULATORY_CARE_PROVIDER_SITE_OTHER): Payer: BC Managed Care – PPO | Admitting: Nurse Practitioner

## 2019-05-12 ENCOUNTER — Telehealth: Payer: Self-pay

## 2019-05-12 ENCOUNTER — Other Ambulatory Visit: Payer: Self-pay

## 2019-05-12 ENCOUNTER — Ambulatory Visit: Payer: BC Managed Care – PPO | Admitting: Nurse Practitioner

## 2019-05-12 ENCOUNTER — Encounter: Payer: Self-pay | Admitting: Nurse Practitioner

## 2019-05-12 VITALS — BP 145/95 | HR 105 | Temp 98.1°F | Resp 16 | Ht 66.0 in | Wt 180.0 lb

## 2019-05-12 DIAGNOSIS — I10 Essential (primary) hypertension: Secondary | ICD-10-CM

## 2019-05-12 DIAGNOSIS — F988 Other specified behavioral and emotional disorders with onset usually occurring in childhood and adolescence: Secondary | ICD-10-CM

## 2019-05-12 MED ORDER — ATENOLOL 25 MG PO TABS: ORAL_TABLET | ORAL | 3 refills | Status: DC

## 2019-05-12 NOTE — Telephone Encounter (Signed)
Confirmed appointment with patient and screened for covid. klh 

## 2019-05-12 NOTE — Progress Notes (Cosign Needed)
[facility] [place], [place] [address]  Internal MEDICINE  Office Visit Note  Patient Name: Summer Webb  [id number]  [id number]  Date of Service: 05/12/2019   Pt is here for a sick visit.  Chief Complaint  Patient presents with  . Hypertension    without adderall and before meal pt bp reading 124/74, on friday right arm read around 140/100 left arm was around 140/80 and took adderall this day     The patient is here for acute visit. 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 They were doing blood pressures and hers was elevated. She believes this is made worse by taking her adderall and drinking coffee every morning. She took her blood pressure at school last week and it was 140/100. She did take her adderall and had coffee. On Sunday, she took er blood pressure at home, after just having coffee, and it was 124/74. She denies chest pain or pressure or palpitations. She sates that her mother and many members of her family have hypertension. We have discussed, in the past, elevated blood pressure and heart rate were possible side effects of taking Adderall. She is in school full time and works part time. Needs to take the Adderall to help with focus and concentration.        Current Medication:  Outpatient Encounter Medications as of 05/12/2019  Medication Sig  . amphetamine-dextroamphetamine (ADDERALL XR) 30 MG 24 hr capsule Take 1 capsule (30 mg total) by mouth daily.  . Ascorbic Acid (VITAMIN C PO) Take by mouth daily.  Marland Kitchen LYSINE PO Take by mouth daily.  Marland Kitchen VITAMIN D PO Take by mouth daily.  Marland Kitchen atenolol (TENORMIN) 25 MG tablet Take 1/2 tablet po QHS   No facility-administered encounter medications on file as of 05/12/2019.      Medical History: Past Medical History:  Diagnosis Date  . ADD (attention deficit disorder)     Today's Vitals   05/12/19 0923  BP: (!) 145/95  Pulse: (!) 105  Resp: 16  Temp: 98.1 F (36.7 C)  SpO2: 97%   Weight: 180 lb (81.6 kg)  Height: 5\' 6"  (1.676 m)   Body mass index is 29.05 kg/m.  Review of Systems  Constitutional: Negative for activity change, chills, fatigue and unexpected weight change.  HENT: Negative for congestion, postnasal drip, rhinorrhea, sneezing and sore throat.   Respiratory: Negative for cough, chest tightness and shortness of breath.   Cardiovascular: Negative for chest pain and palpitations.       Elevated blood pressure and heart rate.   Gastrointestinal: Negative for abdominal pain, constipation, diarrhea, nausea and vomiting.  Endocrine: Negative for cold intolerance, heat intolerance, polydipsia and polyuria.  Musculoskeletal: Negative for arthralgias, back pain, joint swelling and neck pain.  Skin: Negative for rash.  Allergic/Immunologic: Negative for environmental allergies.  Neurological: Negative for dizziness, tremors, numbness and headaches.  Hematological: Negative for adenopathy. Does not bruise/bleed easily.  Psychiatric/Behavioral: Positive for decreased concentration. Negative for behavioral problems (Depression), sleep disturbance and suicidal ideas. The patient is not nervous/anxious.     Physical Exam Vitals and nursing note reviewed.  Constitutional:      General: She is not in acute distress.    Appearance: Normal appearance. She is well-developed. She is not diaphoretic.  HENT:     Head: Normocephalic and atraumatic.     Mouth/Throat:     Pharynx: No oropharyngeal exudate.  Eyes:     Pupils: Pupils are equal, round,  and reactive to light.  Neck:     Thyroid: No thyromegaly.     Vascular: No JVD.     Trachea: No tracheal deviation.  Cardiovascular:     Rate and Rhythm: Regular rhythm. Tachycardia present.     Heart sounds: Normal heart sounds. No murmur. No friction rub. No gallop.   Pulmonary:     Effort: Pulmonary effort is normal. No respiratory distress.     Breath sounds: Normal breath sounds. No wheezing or rales.  Chest:      Chest wall: No tenderness.  Abdominal:     Palpations: Abdomen is soft.  Musculoskeletal:        General: Normal range of motion.     Cervical back: Normal range of motion and neck supple.  Lymphadenopathy:     Cervical: No cervical adenopathy.  Skin:    General: Skin is warm and dry.  Neurological:     Mental Status: She is alert and oriented to person, place, and time.     Cranial Nerves: No cranial nerve deficit.  Psychiatric:        Behavior: Behavior normal.        Thought Content: Thought content normal.        Judgment: Judgment normal.    Assessment/Plan: 1. Essential hypertension Start atenolol 25mg . Advised patient she take 1/2 tablet in the evenings prior to the days when she will take adderall. Recommend she cut back caffeine intake daily. Consider lowering dose of Adderall if no improvement.  - atenolol (TENORMIN) 25 MG tablet; Take 1/2 tablet po QHS  Dispense: 30 tablet; Refill: 3  2. Attention deficit disorder (ADD) without hyperactivity May continue adderall as previously prescribed   General Counseling: Summer Webb verbalizes understanding of the findings of todays visit and agrees with plan of treatment. I have discussed any further diagnostic evaluation that may be needed or ordered today. We also reviewed her medications today. she has been encouraged to call the office with any questions or concerns that should arise related to todays visit.    Counseling:  Hypertension Counseling:   The following hypertensive lifestyle modification were recommended and discussed:  1. Limiting alcohol intake to less than 1 oz/day of ethanol:(24 oz of beer or 8 oz of wine or 2 oz of 100-proof whiskey). 2. Take baby ASA 81 mg daily. 3. Importance of regular aerobic exercise and losing weight. 4. Reduce dietary saturated fat and cholesterol intake for overall cardiovascular health. 5. Maintaining adequate dietary potassium, calcium, and magnesium intake. 6. Regular monitoring  of the blood pressure. 7. Reduce sodium intake to less than 100 mmol/day (less than 2.3 gm of sodium or less than 6 gm of sodium choride)   This patient was seen by FNP Collaboration with Dr Vincent Gros as a part of collaborative care agreement   Time spent with patient included reviewing progress notes, labs, imaging studies, and discussing plan for follow up.   Meds ordered this encounter  Medications  . atenolol (TENORMIN) 25 MG tablet    Sig: Take 1/2 tablet po QHS    Dispense:  30 tablet    Refill:  3    Order Specific Question:   Supervising Provider    Answer:   Lyndon Code [[id number]]    Time spent: 30 Minutes

## 2019-05-12 NOTE — Telephone Encounter (Signed)
Patient did not schedule follow up appointment at check out needs to check school schedule before scheduling appointment. klh

## 2019-07-22 ENCOUNTER — Telehealth: Payer: Self-pay

## 2019-07-22 NOTE — Telephone Encounter (Signed)
CONFIRMED AND SCREENED FOR 07-24-19 OV. 

## 2019-07-24 ENCOUNTER — Ambulatory Visit: Payer: BC Managed Care – PPO | Admitting: Nurse Practitioner

## 2019-07-24 ENCOUNTER — Other Ambulatory Visit: Payer: Self-pay

## 2019-07-24 ENCOUNTER — Encounter: Payer: Self-pay | Admitting: Nurse Practitioner

## 2019-07-24 VITALS — BP 145/80 | HR 85 | Temp 97.5°F | Resp 16 | Ht 66.0 in | Wt 179.2 lb

## 2019-07-24 DIAGNOSIS — K5909 Other constipation: Secondary | ICD-10-CM | POA: Diagnosis not present

## 2019-07-24 DIAGNOSIS — Z79899 Other long term (current) drug therapy: Secondary | ICD-10-CM | POA: Diagnosis not present

## 2019-07-24 DIAGNOSIS — I10 Essential (primary) hypertension: Secondary | ICD-10-CM | POA: Diagnosis not present

## 2019-07-24 DIAGNOSIS — F988 Other specified behavioral and emotional disorders with onset usually occurring in childhood and adolescence: Secondary | ICD-10-CM | POA: Diagnosis not present

## 2019-07-24 MED ORDER — AMPHETAMINE-DEXTROAMPHET ER 30 MG PO CP24: 30.0000 mg | ORAL_CAPSULE | Freq: Every day | ORAL | 0 refills | Status: DC

## 2019-07-24 MED ORDER — LINACLOTIDE 72 MCG PO CAPS: 72.0000 ug | ORAL_CAPSULE | Freq: Every day | ORAL | 3 refills | Status: DC

## 2019-07-24 MED ORDER — ATENOLOL 25 MG PO TABS: ORAL_TABLET | ORAL | 3 refills | Status: DC

## 2019-07-24 NOTE — Progress Notes (Signed)
[facility] [address] [place], [place] [address]  Internal MEDICINE  Office Visit Note  Patient Name: Summer Webb  [id number]  [id number]  Date of Service: 08/06/2019  Chief Complaint  Patient presents with  . Hypertension  . ADD  . Constipation    has tried exercise, eating better, and drinking more water, sister is a Engineer, civil (consulting) and said she might have an internal hemmrhoid or inflammation , taking miralx and colon health supplement, miralax alone does not help her have a bowel movement     The patient is here for routine follow up. Blood pressure is improved from last visit. She is taking atenolol 1/2 tablet at bedtime when she is going to take her adderall the next day. She did not take it today, as she is not taking her adderall today. She does drink coffee as well. She notes that when she takes her adderall and drinks coffee at the same time, she has started to cut back her use of coffee. She has also noted constipation as negative side effects. Did have trial of linzess after her last visit and did very well with this, especially when she drinks plenty of water and eats plenty of fiber.       Current Medication: Outpatient Encounter Medications as of 07/24/2019  Medication Sig  . amphetamine-dextroamphetamine (ADDERALL XR) 30 MG 24 hr capsule Take 1 capsule (30 mg total) by mouth daily.  . Ascorbic Acid (VITAMIN C PO) Take by mouth daily.  Marland Kitchen atenolol (TENORMIN) 25 MG tablet Take 1/2 tablet po QHS  . LYSINE PO Take by mouth daily.  Marland Kitchen VITAMIN D PO Take by mouth daily.  . [DISCONTINUED] amphetamine-dextroamphetamine (ADDERALL XR) 30 MG 24 hr capsule Take 1 capsule (30 mg total) by mouth daily.  . [DISCONTINUED] amphetamine-dextroamphetamine (ADDERALL XR) 30 MG 24 hr capsule Take 1 capsule (30 mg total) by mouth daily.  . [DISCONTINUED] amphetamine-dextroamphetamine (ADDERALL XR) 30 MG 24 hr capsule Take 1 capsule (30 mg total) by mouth daily.  . [DISCONTINUED]  atenolol (TENORMIN) 25 MG tablet Take 1/2 tablet po QHS  . linaclotide (LINZESS) 72 MCG capsule Take 1 capsule (72 mcg total) by mouth daily before breakfast.   No facility-administered encounter medications on file as of 07/24/2019.    Surgical History: History reviewed. No pertinent surgical history.  Medical History: Past Medical History:  Diagnosis Date  . ADD (attention deficit disorder)     Family History: Family History  Problem Relation Age of Onset  . Hypertension Mother     Social History   Socioeconomic History  . Marital status: Single    Spouse name: Not on file  . Number of children: Not on file  . Years of education: Not on file  . Highest education level: Not on file  Occupational History  . Not on file  Tobacco Use  . Smoking status: Never Smoker  . Smokeless tobacco: Never Used  Substance and Sexual Activity  . Alcohol use: Yes    Comment: 2 x month   . Drug use: Never  . Sexual activity: Not on file  Other Topics Concern  . Not on file  Social History Narrative  . Not on file   Social Determinants of Health   Financial Resource Strain:   . Difficulty of Paying Living Expenses:   Food Insecurity:   . Worried About Programme researcher, broadcasting/film/video in the Last Year:   . [employer] of Food in the Last Year:  Transportation Needs:   . Freight forwarder (Medical):   Marland Kitchen Lack of Transportation (Non-Medical):   Physical Activity:   . Days of Exercise per Week:   . Minutes of Exercise per Session:   Stress:   . Feeling of Stress :   Social Connections:   . Frequency of Communication with Friends and Family:   . Frequency of Social Gatherings with Friends and Family:   . Attends Religious Services:   . Active Member of Clubs or Organizations:   . Attends Banker Meetings:   Marland Kitchen Marital Status:   Intimate Partner Violence:   . Fear of Current or Ex-Partner:   . Emotionally Abused:   Marland Kitchen Physically Abused:   . Sexually Abused:       Review  of Systems  Constitutional: Negative for activity change, chills, fatigue and unexpected weight change.  HENT: Negative for congestion, postnasal drip, rhinorrhea, sneezing and sore throat.   Respiratory: Negative for cough, chest tightness and shortness of breath.   Cardiovascular: Negative for chest pain and palpitations.       Improved blood pressure and heart rate.  Gastrointestinal: Positive for constipation. Negative for abdominal pain, diarrhea, nausea and vomiting.  Endocrine: Negative for cold intolerance, heat intolerance, polydipsia and polyuria.  Musculoskeletal: Negative for arthralgias, back pain, joint swelling and neck pain.  Skin: Negative for rash.  Allergic/Immunologic: Negative for environmental allergies.  Neurological: Negative for dizziness, tremors, numbness and headaches.  Hematological: Negative for adenopathy. Does not bruise/bleed easily.  Psychiatric/Behavioral: Positive for decreased concentration. Negative for behavioral problems (Depression), sleep disturbance and suicidal ideas. The patient is not nervous/anxious.     Today's Vitals   07/24/19 0934  BP: (!) 145/80  Pulse: 85  Resp: 16  Temp: (!) 97.5 F (36.4 C)  SpO2: 98%  Weight: 179 lb 3.2 oz (81.3 kg)  Height: 5\' 6"  (1.676 m)   Body mass index is 28.92 kg/m.  Physical Exam Vitals and nursing note reviewed.  Constitutional:      General: She is not in acute distress.    Appearance: Normal appearance. She is well-developed. She is not diaphoretic.  HENT:     Head: Normocephalic and atraumatic.     Nose: Nose normal.     Mouth/Throat:     Pharynx: No oropharyngeal exudate.  Eyes:     Pupils: Pupils are equal, round, and reactive to light.  Neck:     Thyroid: No thyromegaly.     Vascular: No JVD.     Trachea: No tracheal deviation.  Cardiovascular:     Rate and Rhythm: Normal rate and regular rhythm.     Heart sounds: Normal heart sounds. No murmur. No friction rub. No gallop.    Pulmonary:     Effort: Pulmonary effort is normal. No respiratory distress.     Breath sounds: Normal breath sounds. No wheezing or rales.  Chest:     Chest wall: No tenderness.  Abdominal:     Palpations: Abdomen is soft.  Musculoskeletal:        General: Normal range of motion.     Cervical back: Normal range of motion and neck supple.  Lymphadenopathy:     Cervical: No cervical adenopathy.  Skin:    General: Skin is warm and dry.  Neurological:     Mental Status: She is alert and oriented to person, place, and time.     Cranial Nerves: No cranial nerve deficit.  Psychiatric:  Mood and Affect: Mood normal.        Behavior: Behavior normal.        Thought Content: Thought content normal.        Judgment: Judgment normal.    Assessment/Plan:  1. Essential hypertension Improved. Continue atenolol 25, taking 1/2 tablet at bedtime.  - atenolol (TENORMIN) 25 MG tablet; Take 1/2 tablet po QHS  Dispense: 30 tablet; Refill: 3  2. Other constipation New prescription for linzess 40mcg daily.  - linaclotide (LINZESS) 72 MCG capsule; Take 1 capsule (72 mcg total) by mouth daily before breakfast.  Dispense: 30 capsule; Refill: 3  3. Attention deficit disorder (ADD) without hyperactivity May take adderall XR 30 mg daily. Three 30 day prescriptions provided. Dates are 07/24/2019, 08/20/2019, and 09/17/2019 - amphetamine-dextroamphetamine (ADDERALL XR) 30 MG 24 hr capsule; Take 1 capsule (30 mg total) by mouth daily.  Dispense: 30 capsule; Refill: 0   General Counseling: Summer Webb verbalizes understanding of the findings of todays visit and agrees with plan of treatment. I have discussed any further diagnostic evaluation that may be needed or ordered today. We also reviewed her medications today. she has been encouraged to call the office with any questions or concerns that should arise related to todays visit.   Refilled Controlled medications today. Reviewed risks and possible side  effects associated with taking Stimulants. Combination of these drugs with other psychotropic medications could cause dizziness and drowsiness. Pt needs to Monitor symptoms and exercise caution in driving and operating heavy machinery to avoid damages to oneself, to others and to the surroundings. Patient verbalized understanding in this matter. Dependence and abuse for these drugs will be monitored closely. A Controlled substance policy and procedure is on file which allows [facility] to order a urine drug screen test at any visit. Patient understands and agrees with the plan..  This patient was seen by Leretha Pol FNP Collaboration with Dr Lavera Guise as a part of collaborative care agreement  Meds ordered this encounter  Medications  . DISCONTD: amphetamine-dextroamphetamine (ADDERALL XR) 30 MG 24 hr capsule    Sig: Take 1 capsule (30 mg total) by mouth daily.    Dispense:  30 capsule    Refill:  0    Order Specific Question:   Supervising Provider    Answer:   Lavera Guise [[id number]]  . linaclotide (LINZESS) 72 MCG capsule    Sig: Take 1 capsule (72 mcg total) by mouth daily before breakfast.    Dispense:  30 capsule    Refill:  3    Samples provided today. Will call if effective    Order Specific Question:   Supervising Provider    Answer:   Lavera Guise [[id number]]  . DISCONTD: amphetamine-dextroamphetamine (ADDERALL XR) 30 MG 24 hr capsule    Sig: Take 1 capsule (30 mg total) by mouth daily.    Dispense:  30 capsule    Refill:  0    Fill after 08/20/2019    Order Specific Question:   Supervising Provider    Answer:   Lavera Guise [[id number]]  . amphetamine-dextroamphetamine (ADDERALL XR) 30 MG 24 hr capsule    Sig: Take 1 capsule (30 mg total) by mouth daily.    Dispense:  30 capsule    Refill:  0    Fill after 09/17/2019    Order Specific Question:   Supervising Provider    Answer:   Lavera Guise [[id number]]  . atenolol (TENORMIN) 25 MG tablet  Sig: Take 1/2 tablet po QHS     Dispense:  30 tablet    Refill:  3    Order Specific Question:   Supervising Provider    Answer:   Lyndon Code [[id number]]    Total time spent: 25 Minutes   Time spent includes review of chart, medications, test results, and follow up plan with the patient.      Dr Lyndon Code Internal medicine

## 2019-08-06 DIAGNOSIS — Z79899 Other long term (current) drug therapy: Secondary | ICD-10-CM | POA: Insufficient documentation

## 2019-08-18 ENCOUNTER — Other Ambulatory Visit: Payer: Self-pay

## 2019-08-18 DIAGNOSIS — K5909 Other constipation: Secondary | ICD-10-CM

## 2019-08-18 MED ORDER — LINACLOTIDE 72 MCG PO CAPS: 72.0000 ug | ORAL_CAPSULE | Freq: Every day | ORAL | 3 refills | Status: DC
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
## 2019-10-30 ENCOUNTER — Other Ambulatory Visit: Payer: BC Managed Care – PPO | Admitting: Nurse Practitioner

## 2019-11-20 ENCOUNTER — Telehealth: Payer: Self-pay

## 2019-11-20 NOTE — Telephone Encounter (Signed)
Confirmed and screened for 11-24-19 ov. °

## 2019-11-24 ENCOUNTER — Encounter: Payer: Self-pay | Admitting: Nurse Practitioner

## 2019-11-24 ENCOUNTER — Ambulatory Visit: Payer: BC Managed Care – PPO | Admitting: Hospice and Palliative Medicine

## 2019-11-24 ENCOUNTER — Other Ambulatory Visit: Payer: Self-pay

## 2019-11-24 DIAGNOSIS — I10 Essential (primary) hypertension: Secondary | ICD-10-CM

## 2019-11-24 DIAGNOSIS — Z111 Encounter for screening for respiratory tuberculosis: Secondary | ICD-10-CM | POA: Diagnosis not present

## 2019-11-24 DIAGNOSIS — F988 Other specified behavioral and emotional disorders with onset usually occurring in childhood and adolescence: Secondary | ICD-10-CM

## 2019-11-24 DIAGNOSIS — K5909 Other constipation: Secondary | ICD-10-CM | POA: Diagnosis not present

## 2019-11-24 MED ORDER — AMPHETAMINE-DEXTROAMPHET ER 30 MG PO CP24: 30.0000 mg | ORAL_CAPSULE | Freq: Every day | ORAL | 0 refills | Status: DC

## 2019-11-24 NOTE — Progress Notes (Signed)
[facility] [address] [place], [place] [address]  Internal MEDICINE  Office Visit Note  Patient Name: Summer Webb  [id number]  [id number]  Date of Service: 11/24/2019  Chief Complaint  Patient presents with   Follow-up    tb test   Medication Refill    add   Quality Metric Gaps    pap , hep C    HPI Patient is here for routine follow-up for her HTN and ADD. 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 Did not require medication during the summer months while off from school, last taken in May. She is on Adderall XR 30 mg and has been on this dose for some time. Feels that this dose helps with her concentration in being able to study and work.  Has not had any major complications with this medication in the past. Did experience some mild constipation in the past but feels as though the Linzess helped with this. Will start taking her Linzess again once she starts back on her Adderall.  BP is stable, uses her atenolol when she is in school and taking her Adderall as her BP becomes elevated. Needed TB skin test for school. Will return in 3 days for evaluation of skin test.  Current Medication: Outpatient Encounter Medications as of 11/24/2019  Medication Sig   amphetamine-dextroamphetamine (ADDERALL XR) 30 MG 24 hr capsule Take 1 capsule (30 mg total) by mouth daily.   Ascorbic Acid (VITAMIN C PO) Take by mouth daily.   atenolol (TENORMIN) 25 MG tablet Take 1/2 tablet po QHS   linaclotide (LINZESS) 72 MCG capsule Take 1 capsule (72 mcg total) by mouth daily before breakfast.   LYSINE PO Take by mouth daily.   VITAMIN D PO Take by mouth daily.   [DISCONTINUED] amphetamine-dextroamphetamine (ADDERALL XR) 30 MG 24 hr capsule Take 1 capsule (30 mg total) by mouth daily.   [DISCONTINUED] amphetamine-dextroamphetamine (ADDERALL XR) 30 MG 24 hr capsule Take 1 capsule (30 mg total) by mouth daily.    amphetamine-dextroamphetamine (ADDERALL XR) 30 MG 24 hr capsule Take 1 capsule (30 mg total) by mouth daily.   amphetamine-dextroamphetamine (ADDERALL XR) 30 MG 24 hr capsule Take 1 capsule (30 mg total) by mouth daily.   No facility-administered encounter medications on file as of 11/24/2019.   Surgical History: History reviewed. No pertinent surgical history.  Medical History: Past Medical History:  Diagnosis Date   ADD (attention deficit disorder)     Family History: Family History  Problem Relation Age of Onset   Hypertension Mother     Social History   Socioeconomic History   Marital status: Single    Spouse name: Not on file   Number of children: Not on file   Years of education: Not on file   Highest education level: Not on file  Occupational History   Not on file  Tobacco Use   Smoking status: Never Smoker   Smokeless tobacco: Never Used  Vaping Use   Vaping Use: Never used  Substance and Sexual Activity   Alcohol use: Yes    Comment: 2 x month    Drug use: Never   Sexual activity: Not on file  Other Topics Concern   Not on file  Social History Narrative   Not on file   Social Determinants of Health   Financial Resource Strain:    Difficulty of Paying Living Expenses:   Food Insecurity:  Worried About Programme researcher, broadcasting/film/video in the Last Year:    Barista in the Last Year:   Transportation Needs:    Freight forwarder (Medical):    Lack of Transportation (Non-Medical):   Physical Activity:    Days of Exercise per Week:    Minutes of Exercise per Session:   Stress:    Feeling of Stress :   Social Connections:    Frequency of Communication with Friends and Family:    Frequency of Social Gatherings with Friends and Family:    Attends Religious Services:    Active Member of Clubs or Organizations:    Attends Engineer, structural:    Marital Status:   Intimate Partner Violence:    Fear of Current  or Ex-Partner:    Emotionally Abused:    Physically Abused:    Sexually Abused:     Review of Systems  Constitutional: Negative for chills, diaphoresis and fatigue.  HENT: Negative for ear pain, postnasal drip and sinus pressure.   Eyes: Negative for photophobia, discharge, redness, itching and visual disturbance.  Respiratory: Negative for cough, shortness of breath and wheezing.   Cardiovascular: Negative for chest pain, palpitations and leg swelling.  Gastrointestinal: Negative for abdominal pain, constipation, diarrhea, nausea and vomiting.  Genitourinary: Negative for dysuria and flank pain.  Musculoskeletal: Negative for arthralgias, back pain, gait problem and neck pain.  Skin: Negative for color change.  Allergic/Immunologic: Negative for environmental allergies and food allergies.  Neurological: Negative for dizziness and headaches.  Hematological: Does not bruise/bleed easily.  Psychiatric/Behavioral: Negative for agitation, behavioral problems (depression) and hallucinations.    Vital Signs: BP 114/77    Pulse 70    Temp 97.6 F (36.4 C)    Resp 16    Ht 5\' 6"  (1.676 m)    Wt 178 lb 9.6 oz (81 kg)    SpO2 98%    BMI 28.83 kg/m    Physical Exam Constitutional:      General: She is not in acute distress.    Appearance: She is well-developed. She is not diaphoretic.  HENT:     Head: Normocephalic and atraumatic.     Mouth/Throat:     Pharynx: No oropharyngeal exudate.  Eyes:     Pupils: Pupils are equal, round, and reactive to light.  Neck:     Thyroid: No thyromegaly.     Vascular: No JVD.     Trachea: No tracheal deviation.  Cardiovascular:     Rate and Rhythm: Normal rate and regular rhythm.     Heart sounds: Normal heart sounds. No murmur heard.  No friction rub. No gallop.   Pulmonary:     Effort: Pulmonary effort is normal. No respiratory distress.     Breath sounds: No wheezing or rales.  Chest:     Chest wall: No tenderness.  Abdominal:      General: Bowel sounds are normal.     Palpations: Abdomen is soft.  Musculoskeletal:        General: Normal range of motion.     Cervical back: Normal range of motion and neck supple.  Lymphadenopathy:     Cervical: No cervical adenopathy.  Skin:    General: Skin is warm and dry.  Neurological:     Mental Status: She is alert and oriented to person, place, and time.     Cranial Nerves: No cranial nerve deficit.  Psychiatric:        Behavior: Behavior normal.  Thought Content: Thought content normal.        Judgment: Judgment normal.    Assessment/Plan: 1. Attention deficit disorder (ADD) without hyperactivity Continue with Adderall XR 30 mg daily while in school. Will need to follow-up for future refills in 3 months. - amphetamine-dextroamphetamine (ADDERALL XR) 30 MG 24 hr capsule; Take 1 capsule (30 mg total) by mouth daily.  Dispense: 30 capsule; Refill: 0 - amphetamine-dextroamphetamine (ADDERALL XR) 30 MG 24 hr capsule; Take 1 capsule (30 mg total) by mouth daily.  Dispense: 20 capsule; Refill: 0 - amphetamine-dextroamphetamine (ADDERALL XR) 30 MG 24 hr capsule; Take 1 capsule (30 mg total) by mouth daily.  Dispense: 30 capsule; Refill: 0  2. Screening-pulmonary TB - TB Skin Test  3. Essential hypertension BP stable today. Will need to restart her prescribed atenolol once she starts back her Adderall as this has caused HTN in the past for her. Routinely monitors her BP while on her Adderall. Will continue to monitor.  4. Other constipation Continue to use Linzess as needed once starting back her Adderall for symptoms of constipation.  General Counseling: Oluwatoni verbalizes understanding of the findings of todays visit and agrees with plan of treatment. I have discussed any further diagnostic evaluation that may be needed or ordered today. We also reviewed her medications today. she has been encouraged to call the office with any questions or concerns that should arise  related to todays visit.    Orders Placed This Encounter  Procedures   TB Skin Test    Meds ordered this encounter  Medications   DISCONTD: amphetamine-dextroamphetamine (ADDERALL XR) 30 MG 24 hr capsule    Sig: Take 1 capsule (30 mg total) by mouth daily.    Dispense:  30 capsule    Refill:  0   amphetamine-dextroamphetamine (ADDERALL XR) 30 MG 24 hr capsule    Sig: Take 1 capsule (30 mg total) by mouth daily.    Dispense:  30 capsule    Refill:  0    Please refill after 9/02   amphetamine-dextroamphetamine (ADDERALL XR) 30 MG 24 hr capsule    Sig: Take 1 capsule (30 mg total) by mouth daily.    Dispense:  20 capsule    Refill:  0    To be refilled only after 10/2// pt will need to be seen for future refills   amphetamine-dextroamphetamine (ADDERALL XR) 30 MG 24 hr capsule    Sig: Take 1 capsule (30 mg total) by mouth daily.    Dispense:  30 capsule    Refill:  0    Total time spent: 20 Minutes  This patient was seen by Leeanne Deed, AGNP-C in collaboration with Dr. Lyndon Code as a part of a collaborative care agreement.  Time spent includes review of chart, medications, test results, and follow up plan with the patient.   Leeanne Deed, AGNP-C Internal medicine

## 2019-11-26 LAB — TB SKIN TEST: TB Skin Test: NEGATIVE

## 2019-12-01 ENCOUNTER — Other Ambulatory Visit: Payer: Self-pay

## 2019-12-01 DIAGNOSIS — K5909 Other constipation: Secondary | ICD-10-CM

## 2019-12-01 MED ORDER — LINACLOTIDE 72 MCG PO CAPS: 72.0000 ug | ORAL_CAPSULE | Freq: Every day | ORAL | 3 refills | Status: DC

## 2020-01-23 ENCOUNTER — Telehealth: Payer: Self-pay

## 2020-01-23 NOTE — Telephone Encounter (Signed)
Pt called that she coughing and mucus  last last few weeks advised as per taylor need go to urgent care and also we can appt next for follow up

## 2020-03-17 ENCOUNTER — Encounter: Payer: Self-pay | Admitting: Hospice and Palliative Medicine

## 2020-03-17 ENCOUNTER — Ambulatory Visit: Payer: BC Managed Care – PPO | Admitting: Hospice and Palliative Medicine

## 2020-03-17 DIAGNOSIS — F988 Other specified behavioral and emotional disorders with onset usually occurring in childhood and adolescence: Secondary | ICD-10-CM

## 2020-03-17 DIAGNOSIS — Z0001 Encounter for general adult medical examination with abnormal findings: Secondary | ICD-10-CM | POA: Diagnosis not present

## 2020-03-17 DIAGNOSIS — J0140 Acute pansinusitis, unspecified: Secondary | ICD-10-CM | POA: Diagnosis not present

## 2020-03-17 DIAGNOSIS — J302 Other seasonal allergic rhinitis: Secondary | ICD-10-CM

## 2020-03-17 MED ORDER — AZITHROMYCIN 250 MG PO TABS: ORAL_TABLET | ORAL | 0 refills | Status: DC

## 2020-03-17 MED ORDER — AMPHETAMINE-DEXTROAMPHET ER 30 MG PO CP24: 30.0000 mg | ORAL_CAPSULE | Freq: Every day | ORAL | 0 refills | Status: DC

## 2020-03-17 MED ORDER — FLUTICASONE PROPIONATE 50 MCG/ACT NA SUSP: 2.0000 | Freq: Every day | NASAL | 6 refills | Status: AC

## 2020-03-17 NOTE — Progress Notes (Signed)
[facility] [address] [place], [place] [address]  Internal MEDICINE  Office Visit Note  Patient Name: Summer Webb  [id number]  [id number]  Date of Service: 03/22/2020  Chief Complaint  Patient presents with  . Follow-up    3 month ADD, pt would like to sched a CPE w/pap  . Nasal Congestion    mucus x 3 months clear at times slight yellowish color  . 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 amphetamine-dextroamphetamine (ADDERALL XR) 30 MG 24 hr capsule Take 1 capsule (30 mg total) by mouth daily.  Marland Kitchen atenolol (TENORMIN) 25 MG tablet Take 1/2 tablet po QHS  . linaclotide (LINZESS) 72 MCG capsule Take 1 capsule (72 mcg total) by mouth daily before breakfast.  . [DISCONTINUED] amphetamine-dextroamphetamine (ADDERALL XR) 30 MG 24 hr capsule Take 1 capsule (30 mg total) by mouth daily.  . [DISCONTINUED] amphetamine-dextroamphetamine (ADDERALL XR) 30 MG 24 hr capsule Take 1 capsule (30 mg total) by mouth daily.  . [DISCONTINUED] amphetamine-dextroamphetamine (ADDERALL XR) 30 MG 24 hr capsule Take 1 capsule (30 mg total) by mouth daily.  Marland Kitchen azithromycin (ZITHROMAX Z-PAK) 250 MG tablet Take two 250 mg tablets on day one followed by one 250 mg tablet each day for four  days.  . fluticasone (FLONASE) 50 MCG/ACT nasal spray Place 2 sprays into both nostrils daily.  . [DISCONTINUED] Ascorbic Acid (VITAMIN C PO) Take by mouth daily. (Patient not taking: Reported on 03/17/2020)  . [DISCONTINUED] LYSINE PO Take by mouth daily. (Patient not taking: Reported on 03/17/2020)  . [DISCONTINUED] VITAMIN D PO Take by mouth daily. (Patient not taking: Reported on 03/17/2020)   No facility-administered encounter medications on file as of 03/17/2020.    Surgical History: Past Surgical History:  Procedure Laterality Date  . WISDOM TOOTH EXTRACTION      Medical History: Past Medical History:  Diagnosis Date  . ADD (attention deficit disorder)     Family History: Family History  Problem Relation Age of Onset  . Hypertension Mother   . Diabetes Father        pre-diabetes    Social History   Socioeconomic History  . Marital status: Single    Spouse name: Not on file  . Number of children: Not on file  . Years of education: Not on file  . Highest education level: Not on file  Occupational History  . Not on file  Tobacco Use  . Smoking status: Never Smoker  . Smokeless tobacco: Never Used  Vaping Use  . Vaping Use: Never used  Substance and Sexual Activity  . Alcohol use: Yes    Comment: 2 x month   . Drug use: Never  . Sexual activity: Not on file  Other Topics Concern  .  Not on file  Social History Narrative  . Not on file   Social Determinants of Health   Financial Resource Strain:   . Difficulty of Paying Living Expenses: Not on file  Food Insecurity:   . Worried About Programme researcher, broadcasting/film/video in the Last Year: Not on file  . Ran Out of Food in the Last Year: Not on file  Transportation Needs:   . Lack of Transportation (Medical): Not on file  . Lack of Transportation (Non-Medical): Not on file  Physical Activity:   . Days of Exercise per Week: Not on file  . Minutes of Exercise per Session: Not on file  Stress:   . Feeling of Stress :  Not on file  Social Connections:   . Frequency of Communication with Friends and Family: Not on file  . Frequency of Social Gatherings with Friends and Family: Not on file  . Attends Religious Services: Not on file  . Active Member of Clubs or Organizations: Not on file  . Attends Banker Meetings: Not on file  . Marital Status: Not on file  Intimate Partner Violence:   . Fear of Current or Ex-Partner: Not on file  . Emotionally Abused: Not on file  . Physically Abused: Not on file  . Sexually Abused: Not on file      Review of Systems  Constitutional: Negative for chills, diaphoresis and fatigue.  HENT: Positive for congestion, postnasal drip, rhinorrhea and sinus pressure. Negative for ear pain.   Eyes: Negative for photophobia, discharge, redness, itching and visual disturbance.  Respiratory: Negative for cough, shortness of breath and wheezing.   Cardiovascular: Negative for chest pain, palpitations and leg swelling.  Gastrointestinal: Negative for abdominal pain, constipation, diarrhea, nausea and vomiting.  Genitourinary: Negative for dysuria and flank pain.  Musculoskeletal: Negative for arthralgias, back pain, gait problem and neck pain.  Skin: Negative for color change.  Allergic/Immunologic: Negative for environmental allergies and food allergies.  Neurological: Negative for dizziness and headaches.  Hematological: Does not bruise/bleed easily.  Psychiatric/Behavioral: Negative for agitation, behavioral problems (depression) and hallucinations.    Vital Signs: BP 110/63   Pulse 83   Temp 98.3 F (36.8 C)   Resp 16   Ht 5\' 6"  (1.676 m)   Wt 171 lb 9.6 oz (77.8 kg)   SpO2 99%   BMI 27.70 kg/m    Physical Exam Vitals reviewed.  Constitutional:      Appearance: Normal appearance. She is normal weight.  HENT:     Right Ear: Tympanic membrane normal.     Left Ear: Tympanic membrane normal.     Nose: Nose normal.     Mouth/Throat:     Mouth:  Mucous membranes are moist.     Pharynx: Oropharynx is clear.  Cardiovascular:     Rate and Rhythm: Normal rate and regular rhythm.     Pulses: Normal pulses.     Heart sounds: Normal heart sounds.  Pulmonary:     Effort: Pulmonary effort is normal.     Breath sounds: Normal breath sounds.  Abdominal:     General: Abdomen is flat.     Palpations: Abdomen is soft.  Musculoskeletal:        General: Normal range of motion.     Cervical back: Normal range of motion.  Skin:    General: Skin is warm.  Neurological:     General: No focal deficit present.     Mental Status: She is alert and oriented  to person, place, and time. Mental status is at baseline.  Psychiatric:        Mood and Affect: Mood normal.        Behavior: Behavior normal.        Thought Content: Thought content normal.        Judgment: Judgment normal.    Assessment/Plan: 1. Acute non-recurrent pansinusitis Due to prolonged symptoms, will treat with Z-pack - azithromycin (ZITHROMAX Z-PAK) 250 MG tablet; Take two 250 mg tablets on day one followed by one 250 mg tablet each day for four days.  Dispense: 6 each; Refill: 0  2. Seasonal allergies Samples of Zyrtec given in office, encouraged to take daily along with using Flonase nasal spray for allergy symptoms Discussed possible allergy testing if symptoms persist or worsen - fluticasone (FLONASE) 50 MCG/ACT nasal spray; Place 2 sprays into both nostrils daily.  Dispense: 16 g; Refill: 6  3. Attention deficit disorder (ADD) without hyperactivity Stable at this time--30 day supply given May need to consider lowering dose if possible at next visit - amphetamine-dextroamphetamine (ADDERALL XR) 30 MG 24 hr capsule; Take 1 capsule (30 mg total) by mouth daily.  Dispense: 30 capsule; Refill: 0  4. Encounter for routine adult health examination with abnormal findings Labs ordered for upcoming CPE - CBC w/Diff/Platelet - Comprehensive Metabolic Panel (CMET) - Lipid Panel  With LDL/HDL Ratio - Vitamin D (25 hydroxy) - B12  General Counseling: Yasuko verbalizes understanding of the findings of todays visit and agrees with plan of treatment. I have discussed any further diagnostic evaluation that may be needed or ordered today. We also reviewed her medications today. she has been encouraged to call the office with any questions or concerns that should arise related to todays visit.    Orders Placed This Encounter  Procedures  . CBC w/Diff/Platelet  . Comprehensive Metabolic Panel (CMET)  . Lipid Panel With LDL/HDL Ratio  . Vitamin D (25 hydroxy)  . B12    Meds ordered this encounter  Medications  . azithromycin (ZITHROMAX Z-PAK) 250 MG tablet    Sig: Take two 250 mg tablets on day one followed by one 250 mg tablet each day for four days.    Dispense:  6 each    Refill:  0  . fluticasone (FLONASE) 50 MCG/ACT nasal spray    Sig: Place 2 sprays into both nostrils daily.    Dispense:  16 g    Refill:  6  . amphetamine-dextroamphetamine (ADDERALL XR) 30 MG 24 hr capsule    Sig: Take 1 capsule (30 mg total) by mouth daily.    Dispense:  30 capsule    Refill:  0    Time spent: 30 Minutes Time spent includes review of chart, medications, test results and follow-up plan with the patient.  This patient was seen by Leeanne Deed AGNP-C in Collaboration with Dr Lyndon Code as a part of collaborative care agreement     Lubertha Basque. Shoshannah Faubert AGNP-C Internal medicine

## 2020-03-22 ENCOUNTER — Encounter: Payer: Self-pay | Admitting: Hospice and Palliative Medicine

## 2020-03-22 ENCOUNTER — Telehealth: Payer: Self-pay

## 2020-03-22 NOTE — Telephone Encounter (Signed)
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

## 2020-03-23 LAB — CBC WITH DIFFERENTIAL/PLATELET
Basophils Absolute: 0.1 10*3/uL (ref 0.0–0.2)
Basos: 1 %
EOS (ABSOLUTE): 0.1 10*3/uL (ref 0.0–0.4)
Eos: 2 %
Hematocrit: 41.3 % (ref 34.0–46.6)
Hemoglobin: 13.6 g/dL (ref 11.1–15.9)
Immature Grans (Abs): 0 10*3/uL (ref 0.0–0.1)
Immature Granulocytes: 0 %
Lymphocytes Absolute: 2.6 10*3/uL (ref 0.7–3.1)
Lymphs: 30 %
MCH: 30.1 pg (ref 26.6–33.0)
MCHC: 32.9 g/dL (ref 31.5–35.7)
MCV: 91 fL (ref 79–97)
Monocytes Absolute: 0.6 10*3/uL (ref 0.1–0.9)
Monocytes: 6 %
Neutrophils Absolute: 5.4 10*3/uL (ref 1.4–7.0)
Neutrophils: 61 %
Platelets: 368 10*3/uL (ref 150–450)
RBC: 4.52 x10E6/uL (ref 3.77–5.28)
RDW: 12.7 % (ref 11.7–15.4)
WBC: 8.8 10*3/uL (ref 3.4–10.8)

## 2020-03-23 LAB — COMPREHENSIVE METABOLIC PANEL
ALT: 12 IU/L (ref 0–32)
AST: 12 IU/L (ref 0–40)
Albumin/Globulin Ratio: 1.7 (ref 1.2–2.2)
Albumin: 4.9 g/dL (ref 3.9–5.0)
Alkaline Phosphatase: 85 IU/L (ref 44–121)
BUN/Creatinine Ratio: 16 (ref 9–23)
BUN: 11 mg/dL (ref 6–20)
Bilirubin Total: 0.7 mg/dL (ref 0.0–1.2)
CO2: 23 mmol/L (ref 20–29)
Calcium: 10.2 mg/dL (ref 8.7–10.2)
Chloride: 103 mmol/L (ref 96–106)
Creatinine, Ser: 0.7 mg/dL (ref 0.57–1.00)
GFR calc Af Amer: 143 mL/min/{1.73_m2} (ref >59)
GFR calc non Af Amer: 124 mL/min/{1.73_m2} (ref >59)
Globulin, Total: 2.9 g/dL (ref 1.5–4.5)
Glucose: 85 mg/dL (ref 65–99)
Potassium: 4.7 mmol/L (ref 3.5–5.2)
Sodium: 143 mmol/L (ref 134–144)
Total Protein: 7.8 g/dL (ref 6.0–8.5)

## 2020-03-23 LAB — VITAMIN D 25 HYDROXY (VIT D DEFICIENCY, FRACTURES): Vit D, 25-Hydroxy: 21.4 ng/mL — ABNORMAL LOW (ref 30.0–100.0)

## 2020-03-23 LAB — LIPID PANEL WITH LDL/HDL RATIO
Cholesterol, Total: 146 mg/dL (ref 100–199)
HDL: 46 mg/dL (ref >39)
LDL Chol Calc (NIH): 82 mg/dL (ref 0–99)
LDL/HDL Ratio: 1.8 ratio (ref 0.0–3.2)
Triglycerides: 97 mg/dL (ref 0–149)
VLDL Cholesterol Cal: 18 mg/dL (ref 5–40)

## 2020-03-23 LAB — VITAMIN B12: Vitamin B-12: 638 pg/mL (ref 232–1245)

## 2020-03-23 NOTE — Progress Notes (Signed)
Labs reviewed, will discuss low vitamin D at next visit.

## 2020-04-05 ENCOUNTER — Ambulatory Visit: Payer: BC Managed Care – PPO | Admitting: Hospice and Palliative Medicine

## 2020-04-05 ENCOUNTER — Encounter: Payer: Self-pay | Admitting: Hospice and Palliative Medicine

## 2020-04-05 ENCOUNTER — Other Ambulatory Visit: Payer: Self-pay

## 2020-04-05 VITALS — BP 134/76 | HR 88 | Temp 98.0°F | Resp 16 | Ht 66.0 in | Wt 171.0 lb

## 2020-04-05 DIAGNOSIS — F988 Other specified behavioral and emotional disorders with onset usually occurring in childhood and adolescence: Secondary | ICD-10-CM

## 2020-04-05 DIAGNOSIS — I10 Essential (primary) hypertension: Secondary | ICD-10-CM

## 2020-04-05 DIAGNOSIS — Z124 Encounter for screening for malignant neoplasm of cervix: Secondary | ICD-10-CM | POA: Diagnosis not present

## 2020-04-05 DIAGNOSIS — J302 Other seasonal allergic rhinitis: Secondary | ICD-10-CM

## 2020-04-05 DIAGNOSIS — Z0001 Encounter for general adult medical examination with abnormal findings: Secondary | ICD-10-CM

## 2020-04-05 MED ORDER — AMPHETAMINE-DEXTROAMPHET ER 30 MG PO CP24: 30.0000 mg | ORAL_CAPSULE | ORAL | 0 refills | Status: DC

## 2020-04-05 MED ORDER — AMPHETAMINE-DEXTROAMPHET ER 30 MG PO CP24: 30.0000 mg | ORAL_CAPSULE | Freq: Every day | ORAL | 0 refills | Status: DC

## 2020-04-05 NOTE — Progress Notes (Signed)
[facility] [place], [place] [address]  Internal MEDICINE  Office Visit Note  Patient Name: Summer Webb  [id number]  [id number]  Date of Service: 04/08/2020  Chief Complaint  Patient presents with  . Annual Exam    W/ papsmear  . 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, has been taking Adderall throughout her schooling as she struggles with concentration and focus Plans to no longer take Adderall after graduation She has been on break from school for holiday and has not been taking Adderall, she only takes medications on school days or when she is studying for an exam, does not routinely take medication on weekends  Has been prescribed atenolol in the past to be taken as needed if BP is elevated while taking Adderall She would like to continue current dose of Adderall until she graduates  Current Medication: Outpatient Encounter Medications as of 04/05/2020  Medication Sig  . atenolol (TENORMIN) 25 MG tablet Take 1/2 tablet po QHS  . azithromycin (ZITHROMAX Z-PAK) 250 MG tablet Take two 250 mg tablets on day one followed by one 250 mg tablet each day for four days.  . fluticasone (FLONASE) 50 MCG/ACT nasal spray Place 2 sprays into both nostrils daily.  Marland Kitchen linaclotide (LINZESS) 72 MCG capsule Take 1 capsule (72 mcg total) by mouth daily before breakfast.  . [DISCONTINUED] amphetamine-dextroamphetamine (ADDERALL XR) 30 MG 24 hr capsule Take 1 capsule (30 mg total) by mouth daily.  Marland Kitchen amphetamine-dextroamphetamine (ADDERALL XR) 30 MG 24 hr capsule Take 1 capsule (30 mg total) by mouth every morning.  . [DISCONTINUED] amphetamine-dextroamphetamine (ADDERALL XR) 30 MG 24 hr  capsule Take 1 capsule (30 mg total) by mouth daily.  . [DISCONTINUED] amphetamine-dextroamphetamine (ADDERALL XR) 30 MG 24 hr capsule Take 1 capsule (30 mg total) by mouth every morning.   No facility-administered encounter medications on file as of 04/05/2020.    Surgical History: Past Surgical History:  Procedure Laterality Date  . WISDOM TOOTH EXTRACTION      Medical History: Past Medical History:  Diagnosis Date  . ADD (attention deficit disorder)     Family History: Family History  Problem Relation Age of Onset  . Hypertension Mother   . Diabetes Father        pre-diabetes      Review of Systems  Constitutional: Negative for chills, diaphoresis and fatigue.  HENT: Negative for ear pain, postnasal drip and sinus pressure.   Eyes: Negative for photophobia, discharge, redness, itching and visual disturbance.  Respiratory: Negative for cough, shortness of breath and wheezing.   Cardiovascular: Negative for chest pain, palpitations and leg swelling.  Gastrointestinal: Negative for abdominal pain, constipation, diarrhea, nausea and vomiting.  Genitourinary: Negative for dysuria and flank pain.  Musculoskeletal: Negative for arthralgias, back pain, gait problem and neck pain.  Skin: Negative for color change.  Allergic/Immunologic: Negative for environmental allergies and food allergies.  Neurological: Negative for dizziness and headaches.  Hematological: Does not bruise/bleed easily.  Psychiatric/Behavioral: Negative for agitation, behavioral problems (depression) and hallucinations.     Vital Signs: BP 134/76   Pulse 88   Temp 98 F (36.7 C)   Resp 16   Ht _0  (1.676 m)   Wt 171 lb (  77.6 kg)   SpO2 99%   BMI 27.60 kg/m    Physical Exam Vitals reviewed. Exam conducted with a chaperone present.  Constitutional:      Appearance: Normal appearance. She is normal weight.  HENT:     Right Ear: Tympanic membrane normal.     Left Ear: Tympanic membrane  normal.     Nose: Nose normal.     Mouth/Throat:     Mouth: Mucous membranes are dry.  Eyes:     Pupils: Pupils are equal, round, and reactive to light.  Cardiovascular:     Rate and Rhythm: Normal rate and regular rhythm.     Pulses: Normal pulses.     Heart sounds: Normal heart sounds.  Pulmonary:     Effort: Pulmonary effort is normal.     Breath sounds: Normal breath sounds.  Chest:  Breasts:     Right: Normal.     Left: Normal.    Abdominal:     General: Abdomen is flat.     Palpations: Abdomen is soft.  Genitourinary:    Vagina: Normal.     Cervix: Normal.     Uterus: Normal.      Adnexa: Right adnexa normal and left adnexa normal.  Musculoskeletal:     Cervical back: Normal range of motion.  Neurological:     Mental Status: She is alert.    LABS: Recent Results (from the past 2160 hour(s))  CBC w/Diff/Platelet     Status: None   Collection Time: 03/22/20  8:31 AM  Result Value Ref Range   WBC 8.8 3.4 - 10.8 x10E3/uL   RBC 4.52 3.77 - 5.28 x10E6/uL   Hemoglobin 13.6 11.1 - 15.9 g/dL   Hematocrit 41.3 34.0 - 46.6 %   MCV 91 79 - 97 fL   MCH 30.1 26.6 - 33.0 pg   MCHC 32.9 31.5 - 35.7 g/dL   RDW 12.7 11.7 - 15.4 %   Platelets 368 150 - 450 x10E3/uL   Neutrophils 61 Not Estab. %   Lymphs 30 Not Estab. %   Monocytes 6 Not Estab. %   Eos 2 Not Estab. %   Basos 1 Not Estab. %   Neutrophils Absolute 5.4 1.4 - 7.0 x10E3/uL   Lymphocytes Absolute 2.6 0.7 - 3.1 x10E3/uL   Monocytes Absolute 0.6 0.1 - 0.9 x10E3/uL   EOS (ABSOLUTE) 0.1 0.0 - 0.4 x10E3/uL   Basophils Absolute 0.1 0.0 - 0.2 x10E3/uL   Immature Granulocytes 0 Not Estab. %   Immature Grans (Abs) 0.0 0.0 - 0.1 x10E3/uL  Comprehensive Metabolic Panel (CMET)     Status: None   Collection Time: 03/22/20  8:31 AM  Result Value Ref Range   Glucose 85 65 - 99 mg/dL   BUN 11 6 - 20 mg/dL   Creatinine, Ser 0.70 0.57 - 1.00 mg/dL   GFR calc non Af Amer 124 >59 mL/min/1.73   GFR calc Af Amer 143 >59  mL/min/1.73    Comment: **In accordance with recommendations from the NKF-ASN Task force,**   [facility] is in the process of updating its eGFR calculation to the   2021 CKD-EPI creatinine equation that estimates kidney function   without a race variable.    BUN/Creatinine Ratio 16 9 - 23   Sodium 143 134 - 144 mmol/L   Potassium 4.7 3.5 - 5.2 mmol/L   Chloride 103 96 - 106 mmol/L   CO2 23 20 - 29 mmol/L   Calcium 10.2 8.7 - 10.2 mg/dL  Total Protein 7.8 6.0 - 8.5 g/dL   Albumin 4.9 3.9 - 5.0 g/dL   Globulin, Total 2.9 1.5 - 4.5 g/dL   Albumin/Globulin Ratio 1.7 1.2 - 2.2   Bilirubin Total 0.7 0.0 - 1.2 mg/dL   Alkaline Phosphatase 85 44 - 121 IU/L    Comment:               **Please note reference interval change**   AST 12 0 - 40 IU/L   ALT 12 0 - 32 IU/L  Lipid Panel With LDL/HDL Ratio     Status: None   Collection Time: 03/22/20  8:31 AM  Result Value Ref Range   Cholesterol, Total 146 100 - 199 mg/dL   Triglycerides 97 0 - 149 mg/dL   HDL 46 >39 mg/dL   VLDL Cholesterol Cal 18 5 - 40 mg/dL   LDL Chol Calc (NIH) 82 0 - 99 mg/dL   LDL/HDL Ratio 1.8 0.0 - 3.2 ratio    Comment:                                     LDL/HDL Ratio                                             Men  Women                               1/2 Avg.Risk  1.0    1.5                                   Avg.Risk  3.6    3.2                                2X Avg.Risk  6.2    5.0                                3X Avg.Risk  8.0    6.1   Vitamin D (25 hydroxy)     Status: Abnormal   Collection Time: 03/22/20  8:31 AM  Result Value Ref Range   Vit D, 25-Hydroxy 21.4 (L) 30.0 - 100.0 ng/mL    Comment: Vitamin D deficiency has been defined by the [place] practice guideline as a level of serum 25-OH vitamin D less than 20 ng/mL (1,2). The Endocrine Society went on to further define vitamin D insufficiency as a level between 21 and 29 ng/mL (2). 1. IOM (Institute of Medicine).  2010. Dietary reference    intakes for calcium and D. [place]: The    [employer]. 2. Holick MF, Binkley Cullom, Bischoff-Ferrari HA, et al.    Evaluation, treatment, and prevention of vitamin D    deficiency: an Endocrine Society clinical practice    guideline. JCEM. 2011 Jul; 96(7):1911-30.   B12     Status: None   Collection Time: 03/22/20  8:31 AM  Result Value Ref Range   Vitamin B-12 638 232 - 1,245 pg/mL  IGP, Aptima HPV  Status: None   Collection Time: 04/05/20  9:40 AM  Result Value Ref Range   Interpretation NILM     Comment: NEGATIVE FOR INTRAEPITHELIAL LESION OR MALIGNANCY.   Category NIL     Comment: Negative for Intraepithelial Lesion   Adequacy SECNI     Comment: Satisfactory for evaluation. No endocervical component is identified.   Clinician Provided ICD10 Comment     Comment: Z12.4   Performed by: Comment     Comment: Joesphine Bare, Cytotechnologist (ASCP)   Note: Comment     Comment: The Pap smear is a screening test designed to aid in the detection of premalignant and malignant conditions of the uterine cervix.  It is not a diagnostic procedure and should not be used as the sole means of detecting cervical cancer.  Both false-positive and false-negative reports do occur.    Test Methodology Comment     Comment: This liquid based ThinPrep(R) pap test was screened with the use of an image guided system.    HPV Aptima Negative Negative    Comment: This nucleic acid amplification test detects fourteen high-risk HPV types (16,18,31,33,35,39,45,51,52,56,58,59,66,68) without differentiation.   NuSwab Vaginitis Plus (VG+)     Status: Abnormal (Preliminary result)   Collection Time: 04/05/20  9:40 AM  Result Value Ref Range   Atopobium vaginae Moderate - 1 Score   BVAB 2 Low - 0 Score   Megasphaera 1 High - 2 (A) Score    Comment: Calculate total score by adding the 3 individual bacterial vaginosis (BV) marker scores together.  Total  score is interpreted as follows: Total score 0-1: Indicates the absence of BV. Total score   2: Indeterminate for BV. Additional clinical                  data should be evaluated to establish a                  diagnosis. Total score 3-6: Indicates the presence of BV. This test was developed and its performance characteristics determined by [facility].  It has not been cleared or approved by the Food and Drug Administration.    Candida albicans, NAA Negative Negative   Candida glabrata, NAA Negative Negative   Trich vag by NAA WILL FOLLOW    Chlamydia trachomatis, NAA WILL FOLLOW    Neisseria gonorrhoeae, NAA WILL FOLLOW    Assessment/Plan: 1. Encounter for routine adult health examination with abnormal findings Well appearing 19 year old female, reviewed recent labs--normal, taking OTC vitamin D and calcium PAP smear completed today Up to date on PHM  2. Attention deficit disorder (ADD) without hyperactivity Continue with Adderall as needed at this time to complete schooling Scripts sent to pharmacy for 3 months--will need follow-up for further refills, at that time will discuss tapering off of medication due to upcoming graduation PDMP reviewed, overdose risk 0 - amphetamine-dextroamphetamine (ADDERALL XR) 30 MG 24 hr capsule; Take 1 capsule (30 mg total) by mouth every morning.  Dispense: 30 capsule; Refill: 0  3. Routine cervical smear - IGP, Aptima HPV - NuSwab Vaginitis Plus (VG+)  4. Essential hypertension Continue to take propanolol as needed for elevated BP or tachycardia  5. Seasonal allergies Symptoms better controlled at this time, continue to monitor  General Counseling: Yarielys verbalizes understanding of the findings of todays visit and agrees with plan of treatment. I have discussed any further diagnostic evaluation that may be needed or ordered today. We also reviewed her medications today. she has  been encouraged to call the office with any questions or concerns  that should arise related to todays visit.    Counseling:    Orders Placed This Encounter  Procedures  . NuSwab Vaginitis Plus (VG+)    Meds ordered this encounter  Medications  . DISCONTD: amphetamine-dextroamphetamine (ADDERALL XR) 30 MG 24 hr capsule    Sig: Take 1 capsule (30 mg total) by mouth daily.    Dispense:  30 capsule    Refill:  0  . DISCONTD: amphetamine-dextroamphetamine (ADDERALL XR) 30 MG 24 hr capsule    Sig: Take 1 capsule (30 mg total) by mouth every morning.    Dispense:  30 capsule    Refill:  0    Do not fill before 1/12  . amphetamine-dextroamphetamine (ADDERALL XR) 30 MG 24 hr capsule    Sig: Take 1 capsule (30 mg total) by mouth every morning.    Dispense:  30 capsule    Refill:  0    Do not fill before 2/11    Total time spent: 30 Minutes  Time spent includes review of chart, medications, test results, and follow up plan with the patient.   This patient was seen by Casey Burkitt AGNP-C Collaboration with Dr Lavera Guise as a part of collaborative care agreement   Tanna Furry. [facility] Internal Medicine

## 2020-04-07 LAB — IGP, APTIMA HPV: HPV Aptima: NEGATIVE

## 2020-04-08 ENCOUNTER — Encounter: Payer: Self-pay | Admitting: Hospice and Palliative Medicine

## 2020-04-09 LAB — NUSWAB VAGINITIS PLUS (VG+)
Candida albicans, NAA: NEGATIVE
Candida glabrata, NAA: NEGATIVE
Chlamydia trachomatis, NAA: NEGATIVE
Megasphaera 1: HIGH Score — AB
Neisseria gonorrhoeae, NAA: NEGATIVE
Trich vag by NAA: NEGATIVE

## 2020-05-05 ENCOUNTER — Other Ambulatory Visit: Payer: Self-pay

## 2020-05-05 DIAGNOSIS — I10 Essential (primary) hypertension: Secondary | ICD-10-CM

## 2020-05-05 MED ORDER — ATENOLOL 25 MG PO TABS: ORAL_TABLET | ORAL | 3 refills | Status: DC

## 2020-05-07 ENCOUNTER — Other Ambulatory Visit: Payer: BC Managed Care – PPO | Admitting: Nurse Practitioner

## 2020-06-01 ENCOUNTER — Other Ambulatory Visit: Payer: Self-pay

## 2020-06-01 ENCOUNTER — Ambulatory Visit (INDEPENDENT_AMBULATORY_CARE_PROVIDER_SITE_OTHER): Payer: BC Managed Care – PPO | Admitting: Hospice and Palliative Medicine

## 2020-06-01 ENCOUNTER — Encounter: Payer: Self-pay | Admitting: Hospice and Palliative Medicine

## 2020-06-01 VITALS — BP 120/76 | HR 98 | Temp 96.5°F | Ht 66.0 in | Wt 171.0 lb

## 2020-06-01 DIAGNOSIS — F988 Other specified behavioral and emotional disorders with onset usually occurring in childhood and adolescence: Secondary | ICD-10-CM | POA: Diagnosis not present

## 2020-06-01 DIAGNOSIS — F411 Generalized anxiety disorder: Secondary | ICD-10-CM | POA: Diagnosis not present

## 2020-06-01 DIAGNOSIS — R079 Chest pain, unspecified: Secondary | ICD-10-CM

## 2020-06-01 MED ORDER — AMPHETAMINE-DEXTROAMPHET ER 20 MG PO CP24: 20.0000 mg | ORAL_CAPSULE | ORAL | 0 refills | Status: DC

## 2020-06-01 NOTE — Progress Notes (Signed)
[facility] [address] [place], [place] [address]  Internal MEDICINE  Office Visit Note  Patient Name: Summer Webb  [id number]  [id number]  Date of Service: 06/05/2020  Chief Complaint  Patient presents with  . Telephone Assessment    [phone]  . Telephone Screen  . Cough  . Sinusitis    Going on since Saturday ,no fever,no body aches     HPI Connected initially virtually but requested for her to come into office for in person visit C/o recurring congestion and runny nose Symptoms initially restarted about 3 days ago, mild symptoms, denies fevers, body aches or coughing Experiencing same symptoms at out last visits, resolved once she started allergy medication but she has since stopped taking this medicine Also mentions episodes of chest pain--intermittent, occurring when she is at work or school, only lasts a few minutes with each episode, feels as though this is related to her anxiety and stress from being close to finishing her schooling She will be finished with classes in about 2 months and will then begin studying to prepare for state exams  Her plan is to discontinue Adderall once she is finished with schooling and taking her exams    Current Medication: Outpatient Encounter Medications as of 06/01/2020  Medication Sig  . amphetamine-dextroamphetamine (ADDERALL XR) 20 MG 24 hr capsule Take 1 capsule (20 mg total) by mouth every morning.  . fluticasone (FLONASE) 50 MCG/ACT nasal spray Place 2 sprays into both nostrils daily.  . [DISCONTINUED] amphetamine-dextroamphetamine (ADDERALL XR) 30 MG 24 hr capsule Take 1 capsule (30 mg total) by mouth every morning.  . [DISCONTINUED] linaclotide (LINZESS) 72 MCG capsule Take 1 capsule (72 mcg total) by mouth daily before breakfast.  . [DISCONTINUED] atenolol (TENORMIN) 25 MG tablet Take 1/2 tablet po QHS (Patient not taking: Reported on 06/01/2020)  . [DISCONTINUED] azithromycin (ZITHROMAX Z-PAK) 250 MG  tablet Take two 250 mg tablets on day one followed by one 250 mg tablet each day for four days.   No facility-administered encounter medications on file as of 06/01/2020.    Surgical History: Past Surgical History:  Procedure Laterality Date  . WISDOM TOOTH EXTRACTION      Medical History: Past Medical History:  Diagnosis Date  . ADD (attention deficit disorder)     Family History: Family History  Problem Relation Age of Onset  . Hypertension Mother   . Diabetes Father        pre-diabetes    Social History   Socioeconomic History  . Marital status: Single    Spouse name: Not on file  . Number of children: Not on file  . Years of education: Not on file  . Highest education level: Not on file  Occupational History  . Not on file  Tobacco Use  . Smoking status: Never Smoker  . Smokeless tobacco: Never Used  Vaping Use  . Vaping Use: Never used  Substance and Sexual Activity  . Alcohol use: Yes    Comment: 2 x month   . Drug use: Never  . Sexual activity: Not on file  Other Topics Concern  . Not on file  Social History Narrative  . Not on file   Social Determinants of Health   Financial Resource Strain: Not on file  Food Insecurity: Not on file  Transportation Needs: Not on file  Physical Activity: Not on file  Stress: Not on file  Social Connections: Not on file  Intimate Partner Violence: Not on file  Review of Systems  Constitutional: Negative for chills, diaphoresis and fatigue.  HENT: Positive for congestion and rhinorrhea. Negative for ear pain, postnasal drip and sinus pressure.   Eyes: Negative for photophobia, discharge, redness, itching and visual disturbance.  Respiratory: Negative for cough, shortness of breath and wheezing.   Cardiovascular: Positive for chest pain. Negative for palpitations and leg swelling.  Gastrointestinal: Negative for abdominal pain, constipation, diarrhea, nausea and vomiting.  Genitourinary: Negative for dysuria  and flank pain.  Musculoskeletal: Negative for arthralgias, back pain, gait problem and neck pain.  Skin: Negative for color change.  Allergic/Immunologic: Negative for environmental allergies and food allergies.  Neurological: Negative for dizziness and headaches.  Hematological: Does not bruise/bleed easily.  Psychiatric/Behavioral: Negative for agitation, behavioral problems (depression) and hallucinations.    Vital Signs: BP 120/76   Pulse 98   Temp (!) 96.5 F (35.8 C)   Ht 5\' 6"  (1.676 m)   Wt 171 lb (77.6 kg)   SpO2 99%   BMI 27.60 kg/m    Physical Exam Vitals reviewed.  Constitutional:      Appearance: Normal appearance. She is normal weight.  Cardiovascular:     Rate and Rhythm: Normal rate and regular rhythm.     Pulses: Normal pulses.     Heart sounds: Normal heart sounds.  Pulmonary:     Effort: Pulmonary effort is normal.     Breath sounds: Normal breath sounds.  Abdominal:     General: Abdomen is flat.     Palpations: Abdomen is soft.  Musculoskeletal:        General: Normal range of motion.     Cervical back: Normal range of motion.  Skin:    General: Skin is warm.  Neurological:     General: No focal deficit present.     Mental Status: She is alert and oriented to person, place, and time. Mental status is at baseline.  Psychiatric:        Mood and Affect: Mood normal.        Behavior: Behavior normal.        Thought Content: Thought content normal.        Judgment: Judgment normal.    Assessment/Plan: 1. Chest pain, unspecified type Normal EKG - EKG 12-Lead  2. Attention deficit disorder (ADD) without hyperactivity Lower dose of Adderall 20 mg while she completes schooling and taking boards Will continue to further lower dose in order to successfully wean completely Closely monitor chest pain likely related to anxiety and stress - amphetamine-dextroamphetamine (ADDERALL XR) 20 MG 24 hr capsule; Take 1 capsule (20 mg total) by mouth every  morning.  Dispense: 30 capsule; Refill: 0  3. GAD (generalized anxiety disorder) Chest pain likely secondary to stress and anxiety--discussed treatment options, she would like to hold off at this time Lowered dose of Adderall as this may be contributing to GAD and panic attacks  General Counseling: Avyanna verbalizes understanding of the findings of todays visit and agrees with plan of treatment. I have discussed any further diagnostic evaluation that may be needed or ordered today. We also reviewed her medications today. she has been encouraged to call the office with any questions or concerns that should arise related to todays visit.    Orders Placed This Encounter  Procedures  . EKG 12-Lead    Meds ordered this encounter  Medications  . amphetamine-dextroamphetamine (ADDERALL XR) 20 MG 24 hr capsule    Sig: Take 1 capsule (20 mg total) by mouth every morning.  Dispense:  30 capsule    Refill:  0    Time spent: 30 Minutes Time spent includes review of chart, medications, test results and follow-up plan with the patient.  This patient was seen by Leeanne Deed AGNP-C in Collaboration with Dr Lyndon Code as a part of collaborative care agreement     Lubertha Basque. Willean Schurman AGNP-C Internal medicine

## 2020-06-05 ENCOUNTER — Encounter: Payer: Self-pay | Admitting: Hospice and Palliative Medicine

## 2020-06-09 ENCOUNTER — Other Ambulatory Visit: Payer: Self-pay | Admitting: Hospice and Palliative Medicine

## 2020-06-09 MED ORDER — AZITHROMYCIN 250 MG PO TABS: ORAL_TABLET | ORAL | 0 refills | Status: DC

## 2020-06-14 IMAGING — DX CHEST  1 VIEW
1 series · 1 of 1 positions shown · non-contrast
Comparison: None.

CLINICAL DATA: Shortness of breath, cough and fever

EXAM:
CHEST  1 VIEW

[chest ap]
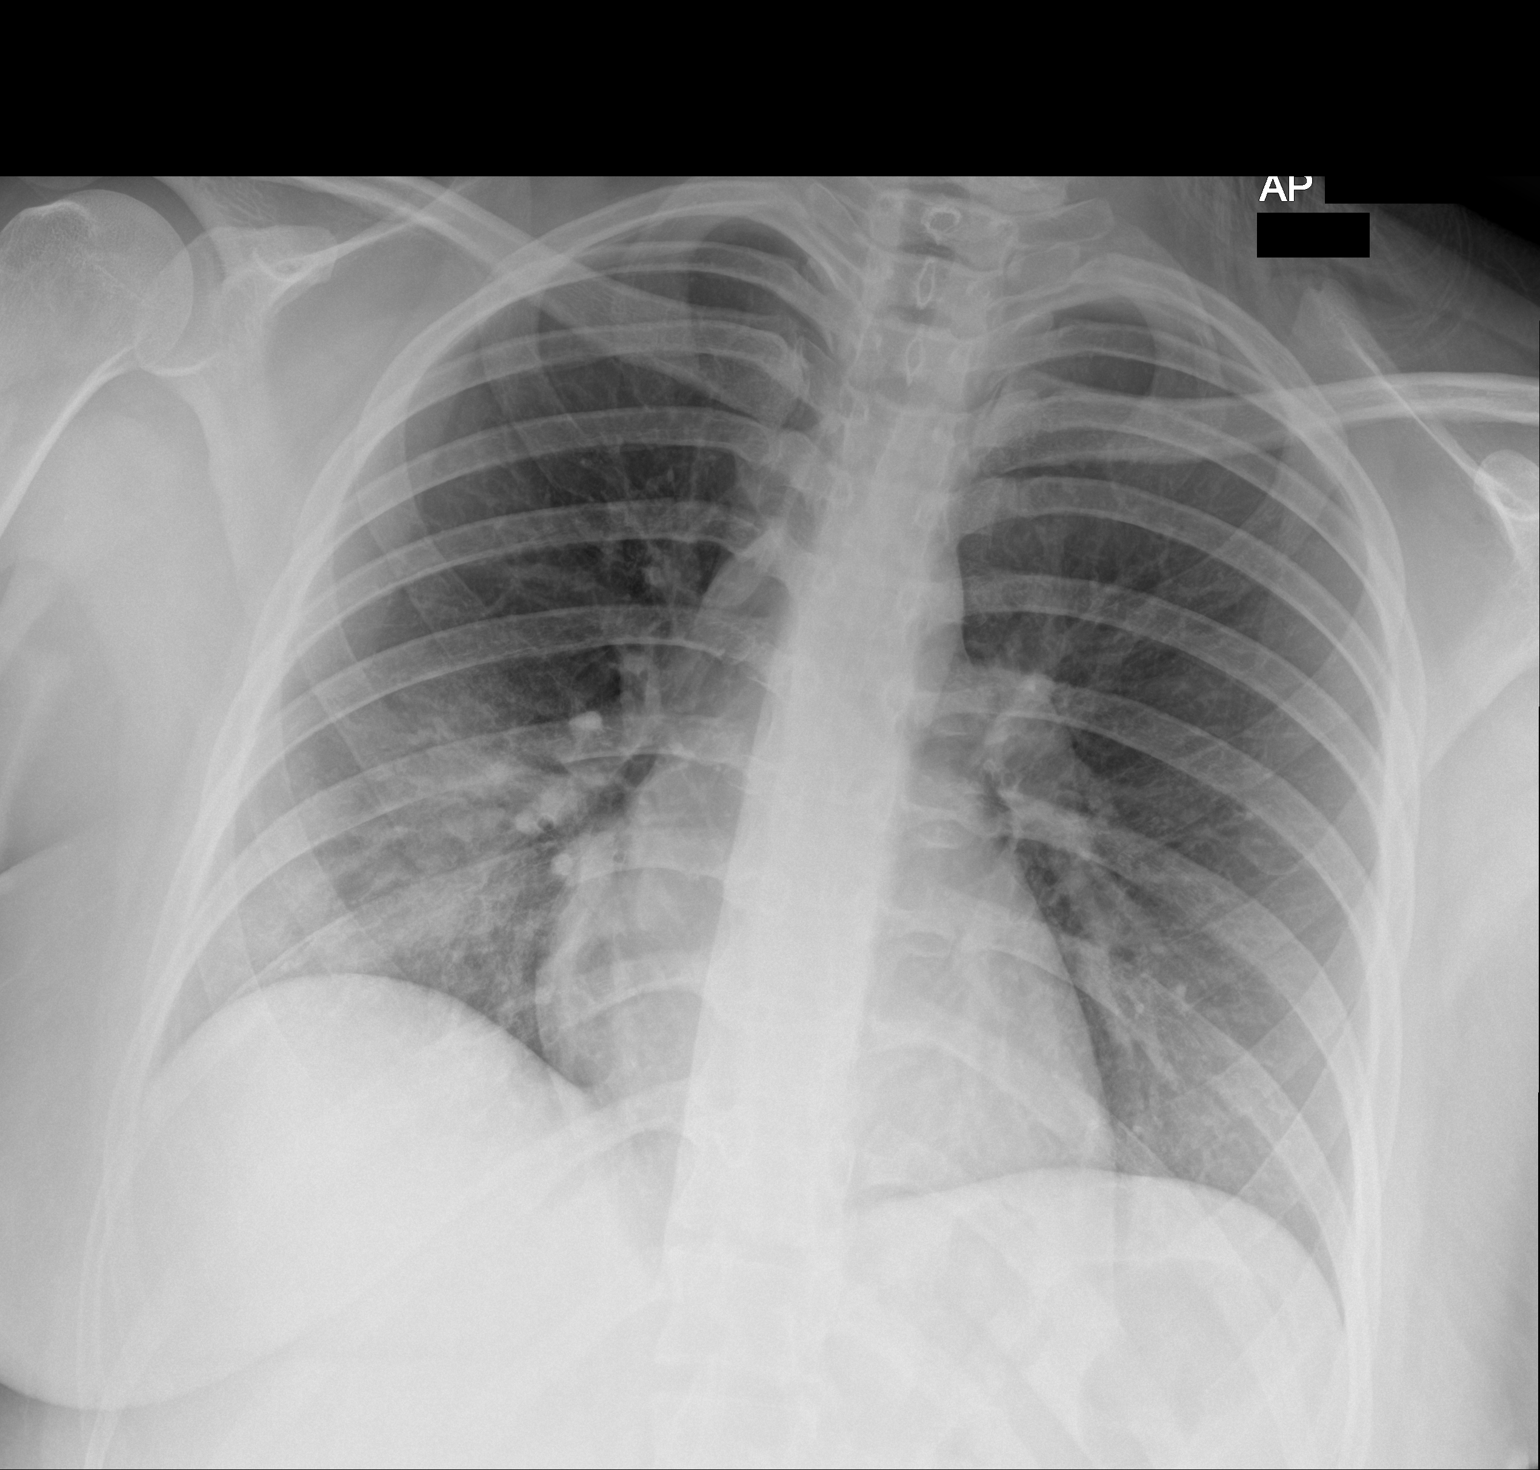

[1 of 1 positions shown; findings below may reference images not displayed]

FINDINGS: There is opacity within the lower right lung likely indicating early
consolidation. No pleural effusion or pneumothorax. Left lung is
clear. Normal cardiomediastinal contours.
IMPRESSION: Lower right lung opacity, likely pneumonia.

## 2020-07-05 ENCOUNTER — Ambulatory Visit: Payer: BC Managed Care – PPO | Admitting: Hospice and Palliative Medicine

## 2020-07-09 ENCOUNTER — Encounter: Payer: Self-pay | Admitting: Hospice and Palliative Medicine

## 2020-07-09 ENCOUNTER — Other Ambulatory Visit: Payer: Self-pay

## 2020-07-09 ENCOUNTER — Ambulatory Visit: Payer: BC Managed Care – PPO | Admitting: Hospice and Palliative Medicine

## 2020-07-09 ENCOUNTER — Telehealth: Payer: Self-pay

## 2020-07-09 VITALS — BP 112/70 | HR 94 | Temp 98.4°F | Resp 16 | Ht 66.0 in | Wt 167.0 lb

## 2020-07-09 DIAGNOSIS — Z79899 Other long term (current) drug therapy: Secondary | ICD-10-CM | POA: Diagnosis not present

## 2020-07-09 DIAGNOSIS — F988 Other specified behavioral and emotional disorders with onset usually occurring in childhood and adolescence: Secondary | ICD-10-CM

## 2020-07-09 DIAGNOSIS — J309 Allergic rhinitis, unspecified: Secondary | ICD-10-CM

## 2020-07-09 LAB — POCT URINE DRUG SCREEN
POC Amphetamine UR: POSITIVE — AB
POC BENZODIAZEPINES UR: NOT DETECTED
POC Barbiturate UR: NOT DETECTED
POC Cocaine UR: NOT DETECTED
POC Ecstasy UR: NOT DETECTED
POC Marijuana UR: NOT DETECTED
POC Methadone UR: NOT DETECTED
POC Methamphetamine UR: NOT DETECTED
POC Opiate Ur: NOT DETECTED
POC Oxycodone UR: NOT DETECTED
POC PHENCYCLIDINE UR: NOT DETECTED
POC TRICYCLICS UR: NOT DETECTED

## 2020-07-09 MED ORDER — AZELASTINE HCL 0.1 % NA SOLN: 1.0000 | Freq: Two times a day (BID) | NASAL | 4 refills | Status: DC

## 2020-07-09 MED ORDER — MONTELUKAST SODIUM 10 MG PO TABS
10.0000 mg | ORAL_TABLET | Freq: Every day | ORAL | 1 refills | Status: DC
Start: 2020-07-09 — End: 2021-02-18

## 2020-07-09 MED ORDER — AMPHETAMINE-DEXTROAMPHET ER 20 MG PO CP24: 20.0000 mg | ORAL_CAPSULE | ORAL | 0 refills | Status: DC

## 2020-07-09 MED ORDER — AMPHETAMINE-DEXTROAMPHETAMINE 10 MG PO TABS
10.0000 mg | ORAL_TABLET | Freq: Every day | ORAL | 0 refills | Status: DC | PRN
Start: 2020-07-09 — End: 2021-04-01

## 2020-07-09 NOTE — Progress Notes (Signed)
[facility] [address] [place], [place] [address]  Internal MEDICINE  Office Visit Note  Patient Name: Summer Webb  [id number]  [id number]  Date of Service: 07/12/2020  Chief Complaint  Patient presents with  . ADD    3 month f-up    HPI Patient is here for routine follow-up At last visit Adderall dosing was lowered due to increased anxiety and starting to wean off Adderall as she will be finishing up her schooling this summer and would like to no longer take medication Anxiety has improved since our last visit but she does not feel Adderall is strong enough Struggling with staying focused on completing school work and studying for upcoming exams and boards--still plans to discontinue medication this summer once she is finished  Complaining of ongoing allergy symptoms, has been an issue for last several months, has been taking OTC Zyrtec as well as Flonase--does not feel this is controlling her symptome Runny nose and watery eyes, drainage and PND Has recently been treated for sinusitis with ZPAK   Current Medication: Outpatient Encounter Medications as of 07/09/2020  Medication Sig  . amphetamine-dextroamphetamine (ADDERALL XR) 20 MG 24 hr capsule Take 1 capsule (20 mg total) by mouth every morning.  Marland Kitchen amphetamine-dextroamphetamine (ADDERALL) 10 MG tablet Take 1 tablet (10 mg total) by mouth daily as needed.  Marland Kitchen amphetamine-dextroamphetamine (ADDERALL) 10 MG tablet Take 1 tablet (10 mg total) by mouth daily as needed.  Marland Kitchen azelastine (ASTELIN) 0.1 % nasal spray Place 1 spray into both nostrils 2 (two) times daily. Use in each nostril as directed  . fluticasone (FLONASE) 50 MCG/ACT nasal spray Place 2 sprays into both nostrils daily.  . montelukast (SINGULAIR) 10 MG tablet Take 1 tablet (10 mg total) by mouth at bedtime.  . [DISCONTINUED] amphetamine-dextroamphetamine (ADDERALL XR) 20 MG 24 hr capsule Take 1 capsule (20 mg total) by mouth every morning.  Marland Kitchen  amphetamine-dextroamphetamine (ADDERALL XR) 20 MG 24 hr capsule Take 1 capsule (20 mg total) by mouth every morning.  . [DISCONTINUED] azithromycin (ZITHROMAX Z-PAK) 250 MG tablet Take one tablet daily for 14 days. (Patient not taking: Reported on 07/09/2020)   No facility-administered encounter medications on file as of 07/09/2020.    Surgical History: Past Surgical History:  Procedure Laterality Date  . WISDOM TOOTH EXTRACTION      Medical History: Past Medical History:  Diagnosis Date  . ADD (attention deficit disorder)     Family History: Family History  Problem Relation Age of Onset  . Hypertension Mother   . Diabetes Father        pre-diabetes    Social History   Socioeconomic History  . Marital status: Single    Spouse name: Not on file  . Number of children: Not on file  . Years of education: Not on file  . Highest education level: Not on file  Occupational History  . Not on file  Tobacco Use  . Smoking status: Never Smoker  . Smokeless tobacco: Never Used  Vaping Use  . Vaping Use: Never used  Substance and Sexual Activity  . Alcohol use: Yes    Comment: 2 x month   . Drug use: Never  . Sexual activity: Not on file  Other Topics Concern  . Not on file  Social History Narrative  . Not on file   Social Determinants of Health   Financial Resource Strain: Not on file  Food Insecurity: Not on file  Transportation Needs: Not on file  Physical Activity: Not on file  Stress: Not on file  Social Connections: Not on file  Intimate Partner Violence: Not on file      Review of Systems  Constitutional: Negative for chills, diaphoresis and fatigue.  HENT: Positive for postnasal drip and rhinorrhea. Negative for ear pain and sinus pressure.   Eyes: Negative for photophobia, discharge, redness, itching and visual disturbance.  Respiratory: Negative for cough, shortness of breath and wheezing.   Cardiovascular: Negative for chest pain, palpitations and leg  swelling.  Gastrointestinal: Negative for abdominal pain, constipation, diarrhea, nausea and vomiting.  Genitourinary: Negative for dysuria and flank pain.  Musculoskeletal: Negative for arthralgias, back pain, gait problem and neck pain.  Skin: Negative for color change.  Allergic/Immunologic: Negative for environmental allergies and food allergies.  Neurological: Negative for dizziness and headaches.  Hematological: Does not bruise/bleed easily.  Psychiatric/Behavioral: Positive for decreased concentration. Negative for agitation, behavioral problems (depression) and hallucinations.    Vital Signs: BP 112/70   Pulse 94   Temp 98.4 F (36.9 C)   Resp 16   Ht 5\' 6"  (1.676 m)   Wt 167 lb (75.8 kg)   SpO2 99%   BMI 26.95 kg/m    Physical Exam Vitals reviewed.  Constitutional:      Appearance: Normal appearance. She is normal weight.  HENT:     Nose: Nose normal.     Mouth/Throat:     Mouth: Mucous membranes are moist.     Pharynx: Oropharynx is clear.  Cardiovascular:     Rate and Rhythm: Normal rate and regular rhythm.     Pulses: Normal pulses.     Heart sounds: Normal heart sounds.  Pulmonary:     Effort: Pulmonary effort is normal.     Breath sounds: Normal breath sounds.  Abdominal:     General: Abdomen is flat.     Palpations: Abdomen is soft.  Musculoskeletal:        General: Normal range of motion.     Cervical back: Normal range of motion.  Skin:    General: Skin is warm.  Neurological:     General: No focal deficit present.     Mental Status: She is alert and oriented to person, place, and time. Mental status is at baseline.  Psychiatric:        Mood and Affect: Mood normal.        Behavior: Behavior normal.        Thought Content: Thought content normal.        Judgment: Judgment normal.    Assessment/Plan: 1. Chronic allergic rhinitis Add Singulair daily and continue with Zyrtec--stop Flonase and start Astelin nasal spray as needed Consider  allergy testing/CT sinuses for ongoing symptoms - montelukast (SINGULAIR) 10 MG tablet; Take 1 tablet (10 mg total) by mouth at bedtime.  Dispense: 90 tablet; Refill: 1 - azelastine (ASTELIN) 0.1 % nasal spray; Place 1 spray into both nostrils 2 (two) times daily. Use in each nostril as directed  Dispense: 30 mL; Refill: 4  2. Attention deficit disorder (ADD) without hyperactivity Continue with XR dosing at 20 mg, may use 10 mg IR as needed to help with studying and preparing for upcoming exams [place] Controlled Substance Database was reviewed by me for overdose risk score (ORS) - amphetamine-dextroamphetamine (ADDERALL) 10 MG tablet; Take 1 tablet (10 mg total) by mouth daily as needed.  Dispense: 30 tablet; Refill: 0 - amphetamine-dextroamphetamine (ADDERALL XR) 20 MG 24 hr capsule; Take 1 capsule (20 mg  total) by mouth every morning.  Dispense: 30 capsule; Refill: 0 - amphetamine-dextroamphetamine (ADDERALL XR) 20 MG 24 hr capsule; Take 1 capsule (20 mg total) by mouth every morning.  Dispense: 30 capsule; Refill: 0 - amphetamine-dextroamphetamine (ADDERALL) 10 MG tablet; Take 1 tablet (10 mg total) by mouth daily as needed.  Dispense: 30 tablet; Refill: 0  3. Encounter for long-term (current) use of medications - POCT Urine Drug Screen  General Counseling: Cataleia verbalizes understanding of the findings of todays visit and agrees with plan of treatment. I have discussed any further diagnostic evaluation that may be needed or ordered today. We also reviewed her medications today. she has been encouraged to call the office with any questions or concerns that should arise related to todays visit.    Orders Placed This Encounter  Procedures  . POCT Urine Drug Screen    Meds ordered this encounter  Medications  . montelukast (SINGULAIR) 10 MG tablet    Sig: Take 1 tablet (10 mg total) by mouth at bedtime.    Dispense:  90 tablet    Refill:  1  . azelastine (ASTELIN) 0.1 % nasal spray    Sig:  Place 1 spray into both nostrils 2 (two) times daily. Use in each nostril as directed    Dispense:  30 mL    Refill:  4  . amphetamine-dextroamphetamine (ADDERALL) 10 MG tablet    Sig: Take 1 tablet (10 mg total) by mouth daily as needed.    Dispense:  30 tablet    Refill:  0  . amphetamine-dextroamphetamine (ADDERALL XR) 20 MG 24 hr capsule    Sig: Take 1 capsule (20 mg total) by mouth every morning.    Dispense:  30 capsule    Refill:  0  . amphetamine-dextroamphetamine (ADDERALL XR) 20 MG 24 hr capsule    Sig: Take 1 capsule (20 mg total) by mouth every morning.    Dispense:  30 capsule    Refill:  0    Do not fill prior to April 15th  . amphetamine-dextroamphetamine (ADDERALL) 10 MG tablet    Sig: Take 1 tablet (10 mg total) by mouth daily as needed.    Dispense:  30 tablet    Refill:  0    Do not fill prior to April 15th    Time spent: 30 Minutes Time spent includes review of chart, medications, test results and follow-up plan with the patient.  This patient was seen by Leeanne Deed AGNP-C in Collaboration with Dr Lyndon Code as a part of collaborative care agreement     Lubertha Basque. Harris AGNP-C Internal medicine

## 2020-07-09 NOTE — Telephone Encounter (Signed)
Patient will call to schedule two month follow up from 07-09-20. JS

## 2020-07-12 ENCOUNTER — Encounter: Payer: Self-pay | Admitting: Hospice and Palliative Medicine

## 2020-09-29 ENCOUNTER — Telehealth: Payer: Self-pay

## 2020-09-29 NOTE — Telephone Encounter (Signed)
Pt called yesterday looking for hep b vaccine date advised her to pickup immunization record at front desk

## 2021-02-18 ENCOUNTER — Other Ambulatory Visit: Payer: Self-pay

## 2021-02-18 DIAGNOSIS — J309 Allergic rhinitis, unspecified: Secondary | ICD-10-CM

## 2021-02-18 MED ORDER — MONTELUKAST SODIUM 10 MG PO TABS: 10.0000 mg | ORAL_TABLET | Freq: Every day | ORAL | 1 refills | Status: DC

## 2021-03-07 ENCOUNTER — Other Ambulatory Visit: Payer: Self-pay

## 2021-03-07 ENCOUNTER — Encounter: Payer: Self-pay | Admitting: Physician Assistant

## 2021-03-07 ENCOUNTER — Telehealth: Payer: BC Managed Care – PPO | Admitting: Physician Assistant

## 2021-03-07 VITALS — Ht 65.0 in | Wt 167.0 lb

## 2021-03-07 DIAGNOSIS — J320 Chronic maxillary sinusitis: Secondary | ICD-10-CM | POA: Diagnosis not present

## 2021-03-07 DIAGNOSIS — R053 Chronic cough: Secondary | ICD-10-CM

## 2021-03-07 MED ORDER — GUAIFENESIN ER 600 MG PO TB12: 600.0000 mg | ORAL_TABLET | Freq: Two times a day (BID) | ORAL | 0 refills | Status: DC

## 2021-03-07 MED ORDER — AMOXICILLIN-POT CLAVULANATE 875-125 MG PO TABS: 1.0000 | ORAL_TABLET | Freq: Two times a day (BID) | ORAL | 0 refills | Status: DC

## 2021-03-07 NOTE — Progress Notes (Signed)
[facility] [address] [place], [place] [address]  Internal MEDICINE  Telephone Visit  Patient Name: Summer Webb  [id number]  [id number]  Date of Service: 03/08/2021  I connected with the patient at 9:06 by telephone and verified the patients identity using two identifiers.   I discussed the limitations, risks, security and privacy concerns of performing an evaluation and management service by telephone and the availability of in person appointments. I also discussed with the patient that there may be a patient responsible charge related to the service.  The patient expressed understanding and agrees to proceed.    Chief Complaint  Patient presents with   Telephone Assessment    Home covid test negative    Telephone Screen    [id number]   Sinusitis   Cough    White mucus     HPI Pt is here for a virtual sick visit -She started having symptoms with mucus production for the past few months. Has been staying the same. Taking singulair and flonase. Has tried zyrtec and mucinex previously. Got better after last ABX but then came back again. -Coughing up mucus. Sometimes cant spit it out while at work and finds it makes her breathing more strained when she has to swallow mucus back down. -Works as Sales executive -Will send for chest xray if not improved with augmentin. Patient will reach out if so and may need pulmonary eval. Advised to take mucinex as well and continue her flonase and singulair. -Denies wheezing or SOB and home covid test negative  Current Medication: Outpatient Encounter Medications as of 03/07/2021  Medication Sig   amoxicillin-clavulanate (AUGMENTIN) 875-125 MG tablet Take 1 tablet by mouth 2 (two) times daily. Take with food.   amphetamine-dextroamphetamine (ADDERALL XR) 20 MG 24 hr capsule Take 1 capsule (20 mg total) by mouth every morning.   amphetamine-dextroamphetamine (ADDERALL XR) 20 MG 24 hr capsule Take 1 capsule (20 mg total) by mouth  every morning.   amphetamine-dextroamphetamine (ADDERALL) 10 MG tablet Take 1 tablet (10 mg total) by mouth daily as needed.   amphetamine-dextroamphetamine (ADDERALL) 10 MG tablet Take 1 tablet (10 mg total) by mouth daily as needed.   azelastine (ASTELIN) 0.1 % nasal spray Place 1 spray into both nostrils 2 (two) times daily. Use in each nostril as directed   fluticasone (FLONASE) 50 MCG/ACT nasal spray Place 2 sprays into both nostrils daily.   guaiFENesin (MUCINEX) 600 MG 12 hr tablet Take 1 tablet (600 mg total) by mouth 2 (two) times daily.   montelukast (SINGULAIR) 10 MG tablet Take 1 tablet (10 mg total) by mouth at bedtime.   No facility-administered encounter medications on file as of 03/07/2021.    Surgical History: Past Surgical History:  Procedure Laterality Date   WISDOM TOOTH EXTRACTION      Medical History: Past Medical History:  Diagnosis Date   ADD (attention deficit disorder)     Family History: Family History  Problem Relation Age of Onset   Hypertension Mother    Diabetes Father        pre-diabetes    Social History   Socioeconomic History   Marital status: Single    Spouse name: Not on file   Number of children: Not on file   Years of education: Not on file   Highest education level: Not on file  Occupational History   Not on file  Tobacco Use   Smoking status: Never   Smokeless tobacco: Never  Vaping Use  Vaping Use: Never used  Substance and Sexual Activity   Alcohol use: Yes    Comment: 2 x month    Drug use: Never   Sexual activity: Not on file  Other Topics Concern   Not on file  Social History Narrative   Not on file   Social Determinants of Health   Financial Resource Strain: Not on file  Food Insecurity: Not on file  Transportation Needs: Not on file  Physical Activity: Not on file  Stress: Not on file  Social Connections: Not on file  Intimate Partner Violence: Not on file      Review of Systems  Constitutional:   Negative for fatigue and fever.  HENT:  Positive for congestion and postnasal drip. Negative for mouth sores.   Respiratory:  Positive for cough. Negative for shortness of breath and wheezing.   Cardiovascular:  Negative for chest pain.  Genitourinary:  Negative for flank pain.  Psychiatric/Behavioral: Negative.     Vital Signs: Ht 5\' 5"  (1.651 m)   Wt 167 lb (75.8 kg)   BMI 27.79 kg/m    Observation/Objective:  Pt is able to carry out conversation  Assessment/Plan: 1. Chronic maxillary sinusitis Will start augmentin Bid with food and start back on mucinex. May continue flonase and singulair as before.  - amoxicillin-clavulanate (AUGMENTIN) 875-125 MG tablet; Take 1 tablet by mouth 2 (two) times daily. Take with food.  Dispense: 20 tablet; Refill: 0 - guaiFENesin (MUCINEX) 600 MG 12 hr tablet; Take 1 tablet (600 mg total) by mouth 2 (two) times daily.  Dispense: 24 tablet; Refill: 0 - DG Chest 2 View; Future  2. Chronic cough If not improving with augmentin may need CXR. Patient will message if so and order is already placed. May need pulmonary eval if continued symptoms. - DG Chest 2 View; Future   General Counseling: Keiarah verbalizes understanding of the findings of today's phone visit and agrees with plan of treatment. I have discussed any further diagnostic evaluation that may be needed or ordered today. We also reviewed her medications today. she has been encouraged to call the office with any questions or concerns that should arise related to todays visit.    Orders Placed This Encounter  Procedures   DG Chest 2 View    Meds ordered this encounter  Medications   amoxicillin-clavulanate (AUGMENTIN) 875-125 MG tablet    Sig: Take 1 tablet by mouth 2 (two) times daily. Take with food.    Dispense:  20 tablet    Refill:  0   guaiFENesin (MUCINEX) 600 MG 12 hr tablet    Sig: Take 1 tablet (600 mg total) by mouth 2 (two) times daily.    Dispense:  24 tablet    Refill:   0    Time spent:25 Minutes    Dr Internal medicine

## 2021-04-01 ENCOUNTER — Other Ambulatory Visit: Payer: Self-pay

## 2021-04-01 ENCOUNTER — Ambulatory Visit (INDEPENDENT_AMBULATORY_CARE_PROVIDER_SITE_OTHER): Payer: BC Managed Care – PPO | Admitting: Nurse Practitioner

## 2021-04-01 ENCOUNTER — Encounter: Payer: Self-pay | Admitting: Nurse Practitioner

## 2021-04-01 VITALS — BP 129/71 | HR 78 | Temp 98.7°F | Resp 16 | Ht 65.0 in | Wt 161.2 lb

## 2021-04-01 DIAGNOSIS — E559 Vitamin D deficiency, unspecified: Secondary | ICD-10-CM

## 2021-04-01 DIAGNOSIS — R300 Dysuria: Secondary | ICD-10-CM

## 2021-04-01 DIAGNOSIS — Z0001 Encounter for general adult medical examination with abnormal findings: Secondary | ICD-10-CM | POA: Diagnosis not present

## 2021-04-01 DIAGNOSIS — J309 Allergic rhinitis, unspecified: Secondary | ICD-10-CM

## 2021-04-01 DIAGNOSIS — E7849 Other hyperlipidemia: Secondary | ICD-10-CM | POA: Diagnosis not present

## 2021-04-01 DIAGNOSIS — Z113 Encounter for screening for infections with a predominantly sexual mode of transmission: Secondary | ICD-10-CM

## 2021-04-01 MED ORDER — GUAIFENESIN ER 600 MG PO TB12: 600.0000 mg | ORAL_TABLET | Freq: Two times a day (BID) | ORAL | 0 refills | Status: DC

## 2021-04-01 MED ORDER — FEXOFENADINE HCL 180 MG PO TABS: 180.0000 mg | ORAL_TABLET | Freq: Every day | ORAL | 2 refills | Status: AC

## 2021-04-01 NOTE — Progress Notes (Signed)
[facility] [place], [place] [address]  Internal MEDICINE  Office Visit Note  Patient Name: Summer Webb  [id number]  [id number]  Date of Service: 04/01/2021  Chief Complaint  Patient presents with   Annual Exam    Still having phlegm, mostly in morning and evenings, discuss blood work    HPI Summer Webb presents for an annual well visit and physical exam. She is a well-appearing 20 yo female.  She has a history of ADHD and was taking generic Adderall but is not taking it right now.  She says she needs it more for when she is in school or doing her see courses for renewal.  She works as a Copywriter, advertising and is worried about contracting blood-borne pathogens so she is requesting testing for hepatitis B, hepatitis C, HIV.  She had her Pap smear last year and it was negative for intraepithelial lesion and malignancy and negative for HPV.  She is due in 2024 for her Pap.  She is having chronic allergic rhinitis and is taking montelukast and Flonase.  She has been taking Mucinex as needed for thick phlegm.  She is requesting routine blood work which will be ordered. She performs self breast exams at home but is not sure what she is supposed to look for. She denies any pain and has no other questions or concerns today.   Current Medication: Outpatient Encounter Medications as of 04/01/2021  Medication Sig   fexofenadine (ALLEGRA) 180 MG tablet Take 1 tablet (180 mg total) by mouth daily.   fluticasone (FLONASE) 50 MCG/ACT nasal spray Place 2 sprays into both nostrils daily.   montelukast (SINGULAIR) 10 MG tablet Take 1 tablet (10 mg total) by mouth at bedtime.   guaiFENesin (MUCINEX) 600 MG 12 hr tablet Take 1 tablet (600 mg total) by mouth 2 (two) times daily.   [DISCONTINUED] amoxicillin-clavulanate (AUGMENTIN) 875-125 MG tablet Take 1 tablet by mouth 2 (two) times daily. Take with food. (Patient not taking: Reported on 04/01/2021)   [DISCONTINUED]  amphetamine-dextroamphetamine (ADDERALL XR) 20 MG 24 hr capsule Take 1 capsule (20 mg total) by mouth every morning. (Patient not taking: Reported on 04/01/2021)   [DISCONTINUED] amphetamine-dextroamphetamine (ADDERALL XR) 20 MG 24 hr capsule Take 1 capsule (20 mg total) by mouth every morning. (Patient not taking: Reported on 04/01/2021)   [DISCONTINUED] amphetamine-dextroamphetamine (ADDERALL) 10 MG tablet Take 1 tablet (10 mg total) by mouth daily as needed. (Patient not taking: Reported on 04/01/2021)   [DISCONTINUED] amphetamine-dextroamphetamine (ADDERALL) 10 MG tablet Take 1 tablet (10 mg total) by mouth daily as needed. (Patient not taking: Reported on 04/01/2021)   [DISCONTINUED] azelastine (ASTELIN) 0.1 % nasal spray Place 1 spray into both nostrils 2 (two) times daily. Use in each nostril as directed (Patient not taking: Reported on 04/01/2021)   [DISCONTINUED] guaiFENesin (MUCINEX) 600 MG 12 hr tablet Take 1 tablet (600 mg total) by mouth 2 (two) times daily. (Patient not taking: Reported on 04/01/2021)   No facility-administered encounter medications on file as of 04/01/2021.    Surgical History: Past Surgical History:  Procedure Laterality Date   WISDOM TOOTH EXTRACTION      Medical History: Past Medical History:  Diagnosis Date   ADD (attention deficit disorder)     Family History: Family History  Problem Relation Age of Onset   Hypertension Mother    Diabetes Father        pre-diabetes    Social History   Socioeconomic History   Marital status:  Single    Spouse name: Not on file   Number of children: Not on file   Years of education: Not on file   Highest education level: Not on file  Occupational History   Not on file  Tobacco Use   Smoking status: Never   Smokeless tobacco: Never  Vaping Use   Vaping Use: Never used  Substance and Sexual Activity   Alcohol use: Yes    Comment: 2 x month    Drug use: Never   Sexual activity: Not on file  Other Topics  Concern   Not on file  Social History Narrative   Not on file   Social Determinants of Health   Financial Resource Strain: Not on file  Food Insecurity: Not on file  Transportation Needs: Not on file  Physical Activity: Not on file  Stress: Not on file  Social Connections: Not on file  Intimate Partner Violence: Not on file      Review of Systems  Constitutional:  Negative for activity change, appetite change, chills, fatigue, fever and unexpected weight change.  HENT:  Positive for postnasal drip and rhinorrhea. Negative for congestion, ear pain, sore throat and trouble swallowing.   Eyes: Negative.   Respiratory: Negative.  Negative for cough, chest tightness, shortness of breath and wheezing.   Cardiovascular: Negative.  Negative for chest pain and palpitations.  Gastrointestinal: Negative.  Negative for abdominal pain, blood in stool, constipation, diarrhea, nausea and vomiting.  Endocrine: Negative.   Genitourinary: Negative.  Negative for difficulty urinating, dysuria, frequency, hematuria and urgency.  Musculoskeletal: Negative.  Negative for arthralgias, back pain, joint swelling, myalgias and neck pain.  Skin: Negative.  Negative for rash and wound.  Allergic/Immunologic: Negative.  Negative for immunocompromised state.  Neurological: Negative.  Negative for dizziness, seizures, numbness and headaches.  Hematological: Negative.   Psychiatric/Behavioral: Negative.  Negative for behavioral problems, self-injury and suicidal ideas. The patient is not nervous/anxious.    Vital Signs: BP 129/71   Pulse 78   Temp 98.7 F (37.1 C)   Resp 16   Ht '5\' 5"'  (1.651 m)   Wt 161 lb 3.2 oz (73.1 kg)   SpO2 99%   BMI 26.83 kg/m    Physical Exam Vitals reviewed.  Constitutional:      General: She is awake. She is not in acute distress.    Appearance: Normal appearance. She is well-developed and normal weight. She is not ill-appearing or diaphoretic.  HENT:     Head:  Normocephalic and atraumatic.     Right Ear: Tympanic membrane, ear canal and external ear normal.     Left Ear: Tympanic membrane, ear canal and external ear normal.     Nose: Congestion and rhinorrhea present.     Mouth/Throat:     Lips: Pink.     Mouth: Mucous membranes are moist.     Pharynx: Oropharynx is clear. Uvula midline. No oropharyngeal exudate or posterior oropharyngeal erythema.  Eyes:     General: Lids are normal. Vision grossly intact. Gaze aligned appropriately. No scleral icterus.       Right eye: No discharge.        Left eye: No discharge.     Extraocular Movements: Extraocular movements intact.     Conjunctiva/sclera: Conjunctivae normal.     Pupils: Pupils are equal, round, and reactive to light.     Funduscopic exam:    Right eye: Red reflex present.        Left eye: Red reflex  present. Neck:     Thyroid: No thyromegaly.     Vascular: No JVD.     Trachea: Trachea and phonation normal. No tracheal deviation.  Cardiovascular:     Rate and Rhythm: Normal rate and regular rhythm.     Pulses: Normal pulses.     Heart sounds: Normal heart sounds, S1 normal and S2 normal. No murmur heard.   No friction rub. No gallop.  Pulmonary:     Effort: Pulmonary effort is normal. No accessory muscle usage or respiratory distress.     Breath sounds: Normal breath sounds and air entry. No stridor. No decreased breath sounds, wheezing, rhonchi or rales.  Chest:     Chest wall: No tenderness.  Breasts:    Right: Normal. No swelling, bleeding, inverted nipple, mass, nipple discharge, skin change or tenderness.     Left: Normal. No swelling, bleeding, inverted nipple, mass, nipple discharge, skin change or tenderness.     Comments: Normal clinical breast exam Abdominal:     General: Bowel sounds are normal. There is no distension.     Palpations: Abdomen is soft. There is no shifting dullness, fluid wave, mass or pulsatile mass.     Tenderness: There is no abdominal tenderness.  There is no guarding or rebound.     Hernia: No hernia is present.  Musculoskeletal:        General: No tenderness or deformity. Normal range of motion.     Cervical back: Normal range of motion and neck supple.  Lymphadenopathy:     Cervical: No cervical adenopathy.     Upper Body:     Right upper body: No supraclavicular, axillary or pectoral adenopathy.     Left upper body: No supraclavicular, axillary or pectoral adenopathy.  Skin:    General: Skin is warm and dry.     Capillary Refill: Capillary refill takes less than 2 seconds.     Coloration: Skin is not pale.     Findings: No erythema or rash.  Neurological:     Mental Status: She is alert and oriented to person, place, and time.     Cranial Nerves: No cranial nerve deficit.     Motor: No abnormal muscle tone.     Coordination: Coordination normal.     Gait: Gait normal.     Deep Tendon Reflexes: Reflexes are normal and symmetric.  Psychiatric:        Mood and Affect: Mood and affect normal.        Behavior: Behavior normal. Behavior is cooperative.        Thought Content: Thought content normal.        Cognition and Memory: Impaired: m.        Judgment: Judgment normal.       Assessment/Plan: 1. Encounter for routine adult health examination with abnormal findings Age-appropriate preventive screenings and vaccinations discussed, annual physical exam completed. Routine labs for health maintenance ordered, see below. PHM updated.    2. Chronic allergic rhinitis Continue guaifenesin for another week. Start fexofenadine 180 mg daily in the morning, continue montelukast 10 mg daily at bedtime. Call clinic if still not improving within 2-3 weeks. - CBC with Differential/Platelet - CMP14+EGFR - guaiFENesin (MUCINEX) 600 MG 12 hr tablet; Take 1 tablet (600 mg total) by mouth 2 (two) times daily.  Dispense: 24 tablet; Refill: 0  3. Other hyperlipidemia Routine labs ordered - CBC with Differential/Platelet -  CMP14+EGFR - Lipid Profile  4. Vitamin D deficiency History of vitamin D  deficiency, repeat lab - Vitamin D (25 hydroxy)  5. Dysuria Routine urinalysis done - UA/M w/rflx Culture, Routine  6. Encounter for screening examination for sexually transmitted disease Lab ordered per patient request.  - HepB+HepC+HIV Panel      General Counseling: Summer Webb verbalizes understanding of the findings of todays visit and agrees with plan of treatment. I have discussed any further diagnostic evaluation that may be needed or ordered today. We also reviewed her medications today. she has been encouraged to call the office with any questions or concerns that should arise related to todays visit.    Orders Placed This Encounter  Procedures   UA/M w/rflx Culture, Routine   CBC with Differential/Platelet   HepB+HepC+HIV Panel   CMP14+EGFR   Vitamin D (25 hydroxy)   Lipid Profile    Meds ordered this encounter  Medications   guaiFENesin (MUCINEX) 600 MG 12 hr tablet    Sig: Take 1 tablet (600 mg total) by mouth 2 (two) times daily.    Dispense:  24 tablet    Refill:  0   fexofenadine (ALLEGRA) 180 MG tablet    Sig: Take 1 tablet (180 mg total) by mouth daily.    Dispense:  30 tablet    Refill:  2    Return in about 1 year (around 04/01/2022) for CPE, [facility] PCP.   Total time spent:30 Minutes Time spent includes review of chart, medications, test results, and follow up plan with the patient.   [place] Controlled Substance Database was reviewed by me.  This patient was seen by Jonetta Osgood, FNP-C in collaboration with Dr. Clayborn Bigness as a part of collaborative care agreement.  Jacolyn Joaquin R. Valetta Fuller, MSN, FNP-C Internal medicine

## 2021-04-02 LAB — UA/M W/RFLX CULTURE, ROUTINE
Bilirubin, UA: NEGATIVE
Glucose, UA: NEGATIVE
Ketones, UA: NEGATIVE
Leukocytes,UA: NEGATIVE
Nitrite, UA: NEGATIVE
Protein,UA: NEGATIVE
RBC, UA: NEGATIVE
Specific Gravity, UA: 1.018 (ref 1.005–1.030)
Urobilinogen, Ur: 0.2 mg/dL (ref 0.2–1.0)
pH, UA: 8 — ABNORMAL HIGH (ref 5.0–7.5)

## 2021-04-02 LAB — MICROSCOPIC EXAMINATION
Bacteria, UA: NONE SEEN
Casts: NONE SEEN /lpf

## 2021-04-04 LAB — CBC WITH DIFFERENTIAL/PLATELET
Basophils Absolute: 0 10*3/uL (ref 0.0–0.2)
Basos: 1 %
EOS (ABSOLUTE): 0.1 10*3/uL (ref 0.0–0.4)
Eos: 1 %
Hematocrit: 37.6 % (ref 34.0–46.6)
Hemoglobin: 12.4 g/dL (ref 11.1–15.9)
Immature Grans (Abs): 0 10*3/uL (ref 0.0–0.1)
Immature Granulocytes: 0 %
Lymphocytes Absolute: 1.7 10*3/uL (ref 0.7–3.1)
Lymphs: 23 %
MCH: 30.5 pg (ref 26.6–33.0)
MCHC: 33 g/dL (ref 31.5–35.7)
MCV: 93 fL (ref 79–97)
Monocytes Absolute: 0.3 10*3/uL (ref 0.1–0.9)
Monocytes: 5 %
Neutrophils Absolute: 5.2 10*3/uL (ref 1.4–7.0)
Neutrophils: 70 %
Platelets: 319 10*3/uL (ref 150–450)
RBC: 4.06 x10E6/uL (ref 3.77–5.28)
RDW: 12 % (ref 11.7–15.4)
WBC: 7.3 10*3/uL (ref 3.4–10.8)

## 2021-04-04 LAB — CMP14+EGFR
ALT: 7 IU/L (ref 0–32)
AST: 14 IU/L (ref 0–40)
Albumin/Globulin Ratio: 2.2 (ref 1.2–2.2)
Albumin: 4.9 g/dL (ref 3.9–5.0)
Alkaline Phosphatase: 60 IU/L (ref 44–121)
BUN/Creatinine Ratio: 12 (ref 9–23)
BUN: 7 mg/dL (ref 6–20)
Bilirubin Total: 0.8 mg/dL (ref 0.0–1.2)
CO2: 22 mmol/L (ref 20–29)
Calcium: 10.1 mg/dL (ref 8.7–10.2)
Chloride: 100 mmol/L (ref 96–106)
Creatinine, Ser: 0.59 mg/dL (ref 0.57–1.00)
Globulin, Total: 2.2 g/dL (ref 1.5–4.5)
Glucose: 87 mg/dL (ref 70–99)
Potassium: 4.3 mmol/L (ref 3.5–5.2)
Sodium: 135 mmol/L (ref 134–144)
Total Protein: 7.1 g/dL (ref 6.0–8.5)
eGFR: 131 mL/min/{1.73_m2} (ref >59)

## 2021-04-04 LAB — HEPB+HEPC+HIV PANEL
HIV Screen 4th Generation wRfx: NONREACTIVE
Hep B C IgM: NEGATIVE
Hep B Core Total Ab: NEGATIVE
Hep B E Ab: NEGATIVE
Hep B E Ag: NEGATIVE
Hep B Surface Ab, Qual: REACTIVE
Hep C Virus Ab: <0.1 s/co ratio (ref 0.0–0.9)
Hepatitis B Surface Ag: NEGATIVE

## 2021-04-04 LAB — LIPID PANEL
Chol/HDL Ratio: 2.7 ratio (ref 0.0–4.4)
Cholesterol, Total: 136 mg/dL (ref 100–199)
HDL: 51 mg/dL (ref >39)
LDL Chol Calc (NIH): 73 mg/dL (ref 0–99)
Triglycerides: 57 mg/dL (ref 0–149)
VLDL Cholesterol Cal: 12 mg/dL (ref 5–40)

## 2021-04-04 LAB — VITAMIN D 25 HYDROXY (VIT D DEFICIENCY, FRACTURES): Vit D, 25-Hydroxy: 22.5 ng/mL — ABNORMAL LOW (ref 30.0–100.0)

## 2021-04-05 ENCOUNTER — Other Ambulatory Visit: Payer: Self-pay | Admitting: Nurse Practitioner

## 2021-04-20 ENCOUNTER — Telehealth: Payer: Self-pay

## 2021-04-20 NOTE — Telephone Encounter (Signed)
Pt advised as per alyssa  all her labs came back normal her vitamin D is low take OTC 2000 IU daily

## 2021-06-24 ENCOUNTER — Ambulatory Visit: Payer: BC Managed Care – PPO | Admitting: Nurse Practitioner

## 2021-09-28 ENCOUNTER — Ambulatory Visit
Admission: EM | Admit: 2021-09-28 | Discharge: 2021-09-28 | Disposition: A | Discharge status: Home / Self Care | Payer: BC Managed Care – PPO | Attending: Family Medicine | Admitting: Family Medicine

## 2021-09-28 ENCOUNTER — Ambulatory Visit (INDEPENDENT_AMBULATORY_CARE_PROVIDER_SITE_OTHER): Payer: BC Managed Care – PPO

## 2021-09-28 DIAGNOSIS — R0789 Other chest pain: Secondary | ICD-10-CM

## 2021-09-28 DIAGNOSIS — R072 Precordial pain: Secondary | ICD-10-CM

## 2021-09-28 MED ORDER — IBUPROFEN 800 MG PO TABS: 800.0000 mg | ORAL_TABLET | Freq: Three times a day (TID) | ORAL | 0 refills | Status: DC | PRN

## 2021-09-28 MED ORDER — ALBUTEROL SULFATE HFA 108 (90 BASE) MCG/ACT IN AERS: 2.0000 | INHALATION_SPRAY | RESPIRATORY_TRACT | 0 refills | Status: DC | PRN

## 2021-09-28 NOTE — ED Triage Notes (Signed)
Patient presents to Urgent Care with complaints of intermittent chest pressure when taking a deep breath, difficulty breathing, greenish phlegm x 1.5 weeks. Treating symptoms with allergy meds.   Denies fever.

## 2021-09-28 NOTE — Discharge Instructions (Addendum)
Your chest x-ray was clear and did not show any sign of pneumonia, fluid, or bronchospasm.  Please your chest being tender on exam, I suspect you have costochondritis or chest wall pain.  The phlegm production may be due to allergies.  I would like you to try Albuterol inhaler--do 2 puffs every 4 hours as needed for shortness of breath or wheezing  Take ibuprofen 800 mg--1 tab every 8 hours as needed for pain.  Please see your primary care office, especially if you are not improving at all.  You may need testing for asthma and other causes of this chest pressure and pain you are having

## 2021-09-28 NOTE — ED Provider Notes (Signed)
[facility]    CSN: [id number] Arrival date & time: 09/28/21  1740      History   Chief Complaint Chief Complaint  Patient presents with   Shortness of Breath    Chest pressure     HPI Summer Webb is a 21 y.o. female.    Shortness of Breath Here for intermittent chest pressure and tightness that is been going on for about 10 days.  It will last an hour or so.  She will have some green phlegm produced that is draining in her throat.  She is taking allergy medicines.  She does not have any fever  Last menstrual cycle was about May 8 or 9.  Past Medical History:  Diagnosis Date   ADD (attention deficit disorder)     Patient Active Problem List   Diagnosis Date Noted   Encounter for long-term (current) use of medications 08/06/2019   Essential hypertension 05/12/2019   Other constipation 04/30/2019   ADD (attention deficit disorder) 10/02/2018   Cardiac murmur 08/23/2016   Fibrocystic breast changes 12/13/2012    Past Surgical History:  Procedure Laterality Date   WISDOM TOOTH EXTRACTION      OB History   No obstetric history on file.      Home Medications    Prior to Admission medications   Medication Sig Start Date End Date Taking? Authorizing Provider  albuterol (VENTOLIN HFA) 108 (90 Base) MCG/ACT inhaler Inhale 2 puffs into the lungs every 4 (four) hours as needed for wheezing or shortness of breath. 09/28/21  Yes Zenia Resides, MD  ibuprofen (ADVIL) 800 MG tablet Take 1 tablet (800 mg total) by mouth every 8 (eight) hours as needed (pain). 09/28/21  Yes Zenia Resides, MD  fexofenadine (ALLEGRA) 180 MG tablet Take 1 tablet (180 mg total) by mouth daily. 04/01/21   Sallyanne Kuster, NP  fluticasone (FLONASE) 50 MCG/ACT nasal spray Place 2 sprays into both nostrils daily. 03/17/20   Theotis Burrow, NP  guaiFENesin (MUCINEX) 600 MG 12 hr tablet Take 1 tablet (600 mg total) by mouth 2 (two) times daily. 04/01/21   Sallyanne Kuster, NP   montelukast (SINGULAIR) 10 MG tablet Take 1 tablet (10 mg total) by mouth at bedtime. 02/18/21   Lyndon Code, MD    Family History Family History  Problem Relation Age of Onset   Hypertension Mother    Diabetes Father        pre-diabetes    Social History Social History   Tobacco Use   Smoking status: Never   Smokeless tobacco: Never  Vaping Use   Vaping Use: Never used  Substance Use Topics   Alcohol use: Yes    Comment: 2 x month    Drug use: Never     Allergies   Patient has no known allergies.   Review of Systems Review of Systems  Respiratory:  Positive for shortness of breath.     Physical Exam Triage Vital Signs ED Triage Vitals  Enc Vitals Group     BP 09/28/21 1754 116/79     Pulse Rate 09/28/21 1754 72     Resp 09/28/21 1754 18     Temp 09/28/21 1754 98.9 F (37.2 C)     Temp Source 09/28/21 1754 Oral     SpO2 09/28/21 1754 99 %     Weight 09/28/21 1753 168 lb (76.2 kg)     Height 09/28/21 1753 5\' 6"  (1.676 m)     Head  Circumference --      Peak Flow --      Pain Score 09/28/21 1753 0     Pain Loc --      Pain Edu? --      Excl. in GC? --    No data found.  Updated Vital Signs BP 116/79 (BP Location: Left Arm)   Pulse 72   Temp 98.9 F (37.2 C) (Oral)   Resp 18   Ht 5\' 6"  (1.676 m)   Wt 76.2 kg   LMP 08/31/2021 (Approximate)   SpO2 99%   BMI 27.12 kg/m   Visual Acuity Right Eye Distance:   Left Eye Distance:   Bilateral Distance:    Right Eye Near:   Left Eye Near:    Bilateral Near:     Physical Exam Vitals reviewed.  Constitutional:      General: She is not in acute distress.    Appearance: She is not toxic-appearing.  HENT:     Right Ear: Tympanic membrane and ear canal normal.     Left Ear: Tympanic membrane and ear canal normal.     Nose: Nose normal.     Mouth/Throat:     Mouth: Mucous membranes are moist.     Pharynx: No oropharyngeal exudate or posterior oropharyngeal erythema.  Eyes:     Extraocular  Movements: Extraocular movements intact.     Conjunctiva/sclera: Conjunctivae normal.     Pupils: Pupils are equal, round, and reactive to light.  Cardiovascular:     Rate and Rhythm: Normal rate and regular rhythm.     Heart sounds: No murmur heard. Pulmonary:     Effort: Pulmonary effort is normal. No respiratory distress.     Breath sounds: No wheezing, rhonchi or rales.     Comments: Possibly expiratory phase is diminished some, but there are no wheezes heard Chest:     Chest wall: Tenderness (There is some tenderness in her left upper chest.) present.  Musculoskeletal:     Cervical back: Neck supple.  Lymphadenopathy:     Cervical: No cervical adenopathy.  Skin:    Capillary Refill: Capillary refill takes less than 2 seconds.     Coloration: Skin is not jaundiced or pale.  Neurological:     General: No focal deficit present.     Mental Status: She is alert and oriented to person, place, and time.  Psychiatric:        Behavior: Behavior normal.     UC Treatments / Results  Labs (all labs ordered are listed, but only abnormal results are displayed) Labs Reviewed - No data to display  EKG   Radiology DG Chest 2 View  Result Date: 09/28/2021 CLINICAL DATA:  Chest tightness and chest pain. EXAM: CHEST - 2 VIEW COMPARISON:  Chest radiograph 11/21/2018 FINDINGS: The heart size and mediastinal contours are within normal limits. Both lungs are clear. The visualized skeletal structures are unremarkable. Negative for a pneumothorax. IMPRESSION: No active cardiopulmonary disease. Electronically Signed   By: 11/23/2018 M.D.   On: 09/28/2021 18:18    Procedures Procedures (including critical care time)  Medications Ordered in UC Medications - No data to display  Initial Impression / Assessment and Plan / UC Course  I have reviewed the triage vital signs and the nursing notes.  Pertinent labs & imaging results that were available during my care of the patient were reviewed by me  and considered in my medical decision making (see chart for details).  Chest x-ray is clear and does not show infiltrate or fluid.  There also are no signs of bronchospasm such as flattened diaphragms.  With her being tender I am going to treat her for chest wall pain or costochondritis.  I discussed with her to see her primary care office in the next week or so, especially if she is not improving.  Sending an inhaler in for her to try and see if it helps her symptoms at all.  By the time her chest x-ray was done the chest pressure was gone again Final Clinical Impressions(s) / UC Diagnoses   Final diagnoses:  Precordial pain  Chest wall pain     Discharge Instructions      Your chest x-ray was clear and did not show any sign of pneumonia, fluid, or bronchospasm.  Please your chest being tender on exam, I suspect you have costochondritis or chest wall pain.  The phlegm production may be due to allergies.  I would like you to try Albuterol inhaler--do 2 puffs every 4 hours as needed for shortness of breath or wheezing  Take ibuprofen 800 mg--1 tab every 8 hours as needed for pain.  Please see your primary care office, especially if you are not improving at all.  You may need testing for asthma and other causes of this chest pressure and pain you are having        ED Prescriptions     Medication Sig Dispense Auth. Provider   albuterol (VENTOLIN HFA) 108 (90 Base) MCG/ACT inhaler Inhale 2 puffs into the lungs every 4 (four) hours as needed for wheezing or shortness of breath. 1 each Zenia ResidesBanister, Zuly Belkin K, MD   ibuprofen (ADVIL) 800 MG tablet Take 1 tablet (800 mg total) by mouth every 8 (eight) hours as needed (pain). 21 tablet Rorie Delmore, Janace ArisPamela K, MD      PDMP not reviewed this encounter.   Zenia ResidesBanister, Ulrick Methot K, MD 09/28/21 [phone]

## 2022-03-21 ENCOUNTER — Encounter: Payer: BC Managed Care – PPO | Admitting: Nurse Practitioner

## 2022-07-29 ENCOUNTER — Other Ambulatory Visit: Payer: Self-pay | Admitting: Nurse Practitioner

## 2022-12-06 ENCOUNTER — Other Ambulatory Visit: Payer: Self-pay

## 2022-12-06 MED ORDER — LINACLOTIDE 72 MCG PO CAPS: 72.0000 ug | ORAL_CAPSULE | Freq: Every day | ORAL | 0 refills | Status: DC

## 2023-01-05 ENCOUNTER — Ambulatory Visit (INDEPENDENT_AMBULATORY_CARE_PROVIDER_SITE_OTHER): Payer: BC Managed Care – PPO | Admitting: Family

## 2023-01-05 VITALS — BP 132/70 | HR 72 | Ht 66.0 in | Wt 174.2 lb

## 2023-01-05 DIAGNOSIS — R142 Eructation: Secondary | ICD-10-CM

## 2023-01-05 DIAGNOSIS — R1010 Upper abdominal pain, unspecified: Secondary | ICD-10-CM | POA: Diagnosis not present

## 2023-01-05 NOTE — Progress Notes (Signed)
Acute Office Visit  Subjective:     Patient ID: Summer Webb, female    DOB: Dec 20, 2000, 22 y.o.   MRN: [id number]  Patient is in today for  Chief Complaint  Patient presents with   Abdominal Pain    Abd pain started 8/22 Burping, passing gas, upper abd pain and around belly button, comes and goes- pt started taking omeprazole OTC that helps a little bit Celceis     Started with belly pain around August 22nd, started with just the pain, then started with burping.  Upper abdomen Around belly button  Comes and goes Was also having food get stuck.   Started taking omeprazole, gas x Better, less pain.  Now having burning in throat.   Only diet change was to switch to Celcius drinks, from coffee.   Immunization records: she brought these with her today, asks if she is up to date?  Pap smear due in December.   Abdominal Pain This is a recurrent problem. The current episode started 1 to 4 weeks ago. The onset quality is sudden. The problem occurs intermittently. The problem has been waxing and waning. The pain is located in the LUQ. The quality of the pain is cramping, aching and burning. Associated symptoms include belching. The pain is aggravated by eating. She has tried proton pump inhibitors and antacids for the symptoms. The treatment provided mild relief.     Review of Systems  Gastrointestinal:  Positive for abdominal pain.  All other systems reviewed and are negative.       Objective:    BP 132/70   Pulse 72   Ht 5\' 6"  (1.676 m)   Wt 174 lb 3.2 oz (79 kg)   SpO2 98%   BMI 28.12 kg/m   Physical Exam Vitals and nursing note reviewed.  Constitutional:      Appearance: Normal appearance. She is normal weight.  HENT:     Head: Normocephalic.  Eyes:     Pupils: Pupils are equal, round, and reactive to light.  Cardiovascular:     Rate and Rhythm: Normal rate.  Pulmonary:     Effort: Pulmonary effort is normal.  Abdominal:     General: Bowel sounds are  normal.  Neurological:     General: No focal deficit present.     Mental Status: She is alert and oriented to person, place, and time.  Psychiatric:        Mood and Affect: Mood is anxious.     Results for orders placed or performed in visit on 01/05/23  Celiac Disease Panel  Result Value Ref Range   Endomysial IgA Negative Negative   Transglutaminase IgA <2 0 - 3 U/mL   IgA/Immunoglobulin A, Serum 147 87 - 352 mg/dL  Allergens (22) Foods  Result Value Ref Range   Wheat IgG 10.0 (H) 0.0 - 1.9 ug/mL   Rye IgG 11.0 (H) 0.0 - 1.9 ug/mL   Oat IgG 11.2 (H) 0.0 - 1.9 ug/mL   Corn IgG 5.6 (H) 0.0 - 1.9 ug/mL   Peanut IgG 2.3 (H) 0.0 - 1.9 ug/mL   Soybean IgG 3.4 (H) 0.0 - 1.9 ug/mL   Shrimp IgG 2.6 (H) 0.0 - 1.9 ug/mL   Tomato IgG 6.5 (H) 0.0 - 1.9 ug/mL   Pork IgG 4.2 (H) 0.0 - 1.9 ug/mL   Potato, White, IgG 3.5 (H) 0.0 - 1.9 ug/mL   Haddock IgG <2.0 0.0 - 1.9 ug/mL   Yeast IgG 3.8 (H) 0.0 -  1.9 ug/mL   Onion IgG 6.2 (H) 0.0 - 1.9 ug/mL   Casein IgG 15.7 (H) 0.0 - 1.9 ug/mL   Cheese, Mold Type IgG 30.7 (H) 0.0 - 1.9 ug/mL   Chicken IgG <2.0 0.0 - 1.9 ug/mL   Lamb IgG 9.1 (H) 0.0 - 1.9 ug/mL   Chocolate/Cacao IgG 4.5 (H) 0.0 - 1.9 ug/mL   Coffee IgG 4.1 (H) 0.0 - 1.9 ug/mL   Egg, Whole IgG 2.1 (H) 0.0 - 1.9 ug/mL   Chili Pepper IgG CANCELED ug/mL   Green Bean IgG 5.7 (H) 0.0 - 1.9 ug/mL    Recent Results (from the past 2160 hour(s))  Celiac Disease Panel     Status: None   Collection Time: 01/05/23 10:53 AM  Result Value Ref Range   Endomysial IgA Negative Negative   Transglutaminase IgA <2 0 - 3 U/mL    Comment:                               Negative        0 -  3                               Weak Positive   4 - 10                               Positive           >10  Tissue Transglutaminase (tTG) has been identified  as the endomysial antigen.  Studies have demonstr-  ated that endomysial IgA antibodies have over 99%  specificity for gluten sensitive enteropathy.     IgA/Immunoglobulin A, Serum 147 87 - 352 mg/dL  Allergens (22) Foods     Status: Abnormal   Collection Time: 01/05/23 10:53 AM  Result Value Ref Range   Wheat IgG 10.0 (H) 0.0 - 1.9 ug/mL   Rye IgG 11.0 (H) 0.0 - 1.9 ug/mL   Oat IgG 11.2 (H) 0.0 - 1.9 ug/mL   Corn IgG 5.6 (H) 0.0 - 1.9 ug/mL   Peanut IgG 2.3 (H) 0.0 - 1.9 ug/mL   Soybean IgG 3.4 (H) 0.0 - 1.9 ug/mL   Shrimp IgG 2.6 (H) 0.0 - 1.9 ug/mL   Tomato IgG 6.5 (H) 0.0 - 1.9 ug/mL   Pork IgG 4.2 (H) 0.0 - 1.9 ug/mL   Potato, White, IgG 3.5 (H) 0.0 - 1.9 ug/mL   Haddock IgG <2.0 0.0 - 1.9 ug/mL   Yeast IgG 3.8 (H) 0.0 - 1.9 ug/mL   Onion IgG 6.2 (H) 0.0 - 1.9 ug/mL   Casein IgG 15.7 (H) 0.0 - 1.9 ug/mL   Cheese, Mold Type IgG 30.7 (H) 0.0 - 1.9 ug/mL   Chicken IgG <2.0 0.0 - 1.9 ug/mL   Lamb IgG 9.1 (H) 0.0 - 1.9 ug/mL   Chocolate/Cacao IgG 4.5 (H) 0.0 - 1.9 ug/mL   Coffee IgG 4.1 (H) 0.0 - 1.9 ug/mL   Egg, Whole IgG 2.1 (H) 0.0 - 1.9 ug/mL   Chili Pepper IgG CANCELED ug/mL    Comment: Test not performed. Unable to perform test due to current unavailability of reagents or discontinuation of test.  Result canceled by the ancillary.    Green Bean IgG 5.7 (H) 0.0 - 1.9 ug/mL  H. pylori breath test     Status: None  Collection Time: 01/09/23  9:49 AM  Result Value Ref Range   H pylori Breath Test Negative Negative       Assessment & Plan:   Problem List Items Addressed This Visit   None Visit Diagnoses     Pain of upper abdomen    -  Primary   Will get some labs to evaluate, but I suspect this is reflux.  If labs normal, will start on meds for this.   Relevant Orders   Celiac Disease Panel (Completed)   Allergens (22) Foods (Completed)   H. pylori breath test   Eructation       Relevant Orders   Celiac Disease Panel (Completed)   Allergens (22) Foods (Completed)   H. pylori breath test      Will get labs today and call with results.  Follow up in 2 weeks.   Return in about 2 weeks (around 01/19/2023)  for F/U.  Total time spent: 20 minutes  Miki Kins, FNP  01/05/2023   This document may have been prepared by [facility] Voice Recognition software and as such may include unintentional dictation errors.

## 2023-01-08 ENCOUNTER — Encounter: Payer: Self-pay | Admitting: Family

## 2023-01-08 LAB — ALLERGENS (22) FOODS IGG
Casein IgG: 15.7 ug/mL — ABNORMAL HIGH (ref 0.0–1.9)
Cheese, Mold Type IgG: 30.7 ug/mL — ABNORMAL HIGH (ref 0.0–1.9)
Chicken IgG: <2 ug/mL (ref 0.0–1.9)
Chocolate/Cacao IgG: 4.5 ug/mL — ABNORMAL HIGH (ref 0.0–1.9)
Coffee IgG: 4.1 ug/mL — ABNORMAL HIGH (ref 0.0–1.9)
Corn IgG: 5.6 ug/mL — ABNORMAL HIGH (ref 0.0–1.9)
Egg, Whole IgG: 2.1 ug/mL — ABNORMAL HIGH (ref 0.0–1.9)
Green Bean IgG: 5.7 ug/mL — ABNORMAL HIGH (ref 0.0–1.9)
Haddock IgG: <2 ug/mL (ref 0.0–1.9)
Lamb IgG: 9.1 ug/mL — ABNORMAL HIGH (ref 0.0–1.9)
Oat IgG: 11.2 ug/mL — ABNORMAL HIGH (ref 0.0–1.9)
Onion IgG: 6.2 ug/mL — ABNORMAL HIGH (ref 0.0–1.9)
Peanut IgG: 2.3 ug/mL — ABNORMAL HIGH (ref 0.0–1.9)
Pork IgG: 4.2 ug/mL — ABNORMAL HIGH (ref 0.0–1.9)
Potato, White, IgG: 3.5 ug/mL — ABNORMAL HIGH (ref 0.0–1.9)
Rye IgG: 11 ug/mL — ABNORMAL HIGH (ref 0.0–1.9)
Shrimp IgG: 2.6 ug/mL — ABNORMAL HIGH (ref 0.0–1.9)
Soybean IgG: 3.4 ug/mL — ABNORMAL HIGH (ref 0.0–1.9)
Tomato IgG: 6.5 ug/mL — ABNORMAL HIGH (ref 0.0–1.9)
Wheat IgG: 10 ug/mL — ABNORMAL HIGH (ref 0.0–1.9)
Yeast IgG: 3.8 ug/mL — ABNORMAL HIGH (ref 0.0–1.9)

## 2023-01-08 LAB — CELIAC DISEASE PANEL
Endomysial IgA: NEGATIVE
IgA/Immunoglobulin A, Serum: 147 mg/dL (ref 87–352)
Transglutaminase IgA: <2 U/mL (ref 0–3)

## 2023-01-09 ENCOUNTER — Other Ambulatory Visit: Payer: BC Managed Care – PPO

## 2023-01-09 ENCOUNTER — Other Ambulatory Visit: Payer: Self-pay | Admitting: Family

## 2023-01-09 DIAGNOSIS — R1084 Generalized abdominal pain: Secondary | ICD-10-CM

## 2023-01-11 LAB — H. PYLORI BREATH TEST: H pylori Breath Test: NEGATIVE

## 2023-01-15 ENCOUNTER — Telehealth: Payer: Self-pay

## 2023-02-01 NOTE — Telephone Encounter (Signed)
Patient notified

## 2023-02-02 ENCOUNTER — Ambulatory Visit (INDEPENDENT_AMBULATORY_CARE_PROVIDER_SITE_OTHER): Payer: BC Managed Care – PPO | Admitting: Family

## 2023-02-02 VITALS — BP 130/80 | HR 90 | Ht 66.0 in | Wt 171.8 lb

## 2023-02-02 DIAGNOSIS — Z0130 Encounter for examination of blood pressure without abnormal findings: Secondary | ICD-10-CM

## 2023-02-02 DIAGNOSIS — R2232 Localized swelling, mass and lump, left upper limb: Secondary | ICD-10-CM | POA: Diagnosis not present

## 2023-02-04 ENCOUNTER — Encounter: Payer: Self-pay | Admitting: Family

## 2023-02-09 ENCOUNTER — Ambulatory Visit: Payer: BC Managed Care – PPO | Admitting: Family

## 2023-02-22 DIAGNOSIS — Z3490 Encounter for supervision of normal pregnancy, unspecified, unspecified trimester: Secondary | ICD-10-CM | POA: Insufficient documentation

## 2023-03-08 ENCOUNTER — Other Ambulatory Visit: Payer: Self-pay | Admitting: Family

## 2023-03-18 LAB — OB RESULTS CONSOLE RPR: RPR: NONREACTIVE

## 2023-03-18 LAB — OB RESULTS CONSOLE GC/CHLAMYDIA
Chlamydia: NEGATIVE
Neisseria Gonorrhea: NEGATIVE

## 2023-03-18 LAB — OB RESULTS CONSOLE RUBELLA ANTIBODY, IGM: Rubella: IMMUNE

## 2023-03-18 LAB — OB RESULTS CONSOLE HIV ANTIBODY (ROUTINE TESTING): HIV: NONREACTIVE

## 2023-03-18 LAB — OB RESULTS CONSOLE VARICELLA ZOSTER ANTIBODY, IGG: Varicella: IMMUNE

## 2023-03-18 LAB — OB RESULTS CONSOLE HEPATITIS B SURFACE ANTIGEN: Hepatitis B Surface Ag: NEGATIVE

## 2023-03-24 ENCOUNTER — Encounter: Payer: Self-pay | Admitting: Family

## 2023-03-24 NOTE — Progress Notes (Signed)
Acute Office Visit  Subjective:     Patient ID: Summer Webb, female    DOB: May 05, 2000, 22 y.o.   MRN: [id number]  Patient is in today for  Chief Complaint  Patient presents with   Follow-up    Bump under armpit    Patient is here today with a bump under her left axilla.  The bump has been there for about a month, but it is starting to hurt.   She says that she gets some that come and go, but this one has been hanging around.   No other concerns today.      Review of Systems  All other systems reviewed and are negative.       Objective:    BP 130/80   Pulse 90   Ht 5\' 6"  (1.676 m)   Wt 171 lb 12.8 oz (77.9 kg)   SpO2 99%   BMI 27.73 kg/m   Physical Exam Vitals and nursing note reviewed.  Constitutional:      Appearance: Normal appearance. She is normal weight.  HENT:     Head: Normocephalic.  Eyes:     Extraocular Movements: Extraocular movements intact.     Conjunctiva/sclera: Conjunctivae normal.     Pupils: Pupils are equal, round, and reactive to light.  Cardiovascular:     Rate and Rhythm: Normal rate.  Pulmonary:     Effort: Pulmonary effort is normal.  Skin:    Findings: Abscess (left axilla) present.  Neurological:     General: No focal deficit present.     Mental Status: She is alert and oriented to person, place, and time. Mental status is at baseline.  Psychiatric:        Mood and Affect: Mood normal.        Behavior: Behavior normal.        Thought Content: Thought content normal.     No results found for any visits on 02/02/23.  Recent Results (from the past 2160 hour(s))  Celiac Disease Panel     Status: None   Collection Time: 01/05/23 10:53 AM  Result Value Ref Range   Endomysial IgA Negative Negative   Transglutaminase IgA <2 0 - 3 U/mL    Comment:                               Negative        0 -  3                               Weak Positive   4 - 10                               Positive           >10  Tissue  Transglutaminase (tTG) has been identified  as the endomysial antigen.  Studies have demonstr-  ated that endomysial IgA antibodies have over 99%  specificity for gluten sensitive enteropathy.    IgA/Immunoglobulin A, Serum 147 87 - 352 mg/dL  Allergens (22) Foods     Status: Abnormal   Collection Time: 01/05/23 10:53 AM  Result Value Ref Range   Wheat IgG 10.0 (H) 0.0 - 1.9 ug/mL   Rye IgG 11.0 (H) 0.0 - 1.9 ug/mL   Oat IgG 11.2 (H) 0.0 - 1.9 ug/mL  Corn IgG 5.6 (H) 0.0 - 1.9 ug/mL   Peanut IgG 2.3 (H) 0.0 - 1.9 ug/mL   Soybean IgG 3.4 (H) 0.0 - 1.9 ug/mL   Shrimp IgG 2.6 (H) 0.0 - 1.9 ug/mL   Tomato IgG 6.5 (H) 0.0 - 1.9 ug/mL   Pork IgG 4.2 (H) 0.0 - 1.9 ug/mL   Potato, White, IgG 3.5 (H) 0.0 - 1.9 ug/mL   Haddock IgG <2.0 0.0 - 1.9 ug/mL   Yeast IgG 3.8 (H) 0.0 - 1.9 ug/mL   Onion IgG 6.2 (H) 0.0 - 1.9 ug/mL   Casein IgG 15.7 (H) 0.0 - 1.9 ug/mL   Cheese, Mold Type IgG 30.7 (H) 0.0 - 1.9 ug/mL   Chicken IgG <2.0 0.0 - 1.9 ug/mL   Lamb IgG 9.1 (H) 0.0 - 1.9 ug/mL   Chocolate/Cacao IgG 4.5 (H) 0.0 - 1.9 ug/mL   Coffee IgG 4.1 (H) 0.0 - 1.9 ug/mL   Egg, Whole IgG 2.1 (H) 0.0 - 1.9 ug/mL   Chili Pepper IgG CANCELED ug/mL    Comment: Test not performed. Unable to perform test due to current unavailability of reagents or discontinuation of test.  Result canceled by the ancillary.    Green Bean IgG 5.7 (H) 0.0 - 1.9 ug/mL  H. pylori breath test     Status: None   Collection Time: 01/09/23  9:49 AM  Result Value Ref Range   H pylori Breath Test Negative Negative    Allergies as of 02/02/2023   No Known Allergies      Medication List        Accurate as of February 02, 2023 11:59 PM. If you have any questions, ask your nurse or doctor.          albuterol 108 (90 Base) MCG/ACT inhaler Commonly known as: VENTOLIN HFA Inhale 2 puffs into the lungs every 4 (four) hours as needed for wheezing or shortness of breath.   famotidine-calcium carbonate-magnesium hydroxide  10-800-165 MG chewable tablet Commonly known as: PEPCID COMPLETE Chew 1 tablet by mouth daily as needed.   fexofenadine 180 MG tablet Commonly known as: ALLEGRA Take 1 tablet (180 mg total) by mouth daily.   fluticasone 50 MCG/ACT nasal spray Commonly known as: FLONASE Place 2 sprays into both nostrils daily.   guaiFENesin 600 MG 12 hr tablet Commonly known as: Mucinex Take 1 tablet (600 mg total) by mouth 2 (two) times daily.   ibuprofen 800 MG tablet Commonly known as: ADVIL Take 1 tablet (800 mg total) by mouth every 8 (eight) hours as needed (pain).   linaclotide 72 MCG capsule Commonly known as: Linzess Take 1 capsule (72 mcg total) by mouth daily before breakfast.   montelukast 10 MG tablet Commonly known as: SINGULAIR Take 1 tablet (10 mg total) by mouth at bedtime.   prenatal multivitamin Tabs tablet Take 1 tablet by mouth daily at 12 noon.   senna-docusate 8.6-50 MG tablet Commonly known as: Senokot-S Take 1 tablet by mouth daily.            Assessment & Plan:   Problem List Items Addressed This Visit   None Visit Diagnoses     Lump of axilla, left    -  Primary   suggested patient use warm compresses to help resolve.  Bump feels similar to HS lesion, too deep to drain. Will send abx if not resolving.        No follow-ups on file.  Total time spent: 20 minutes  Cherilyn Sautter M Micky Sheller,  FNP  02/02/2023   This document may have been prepared by [facility] Voice Recognition software and as such may include unintentional dictation errors.

## 2023-04-06 ENCOUNTER — Ambulatory Visit: Payer: BC Managed Care – PPO | Admitting: Family

## 2023-04-12 ENCOUNTER — Encounter: Payer: Self-pay | Admitting: Family

## 2023-04-17 ENCOUNTER — Other Ambulatory Visit: Payer: Self-pay | Admitting: Family

## 2023-04-17 MED ORDER — ALBUTEROL SULFATE HFA 108 (90 BASE) MCG/ACT IN AERS: 2.0000 | INHALATION_SPRAY | RESPIRATORY_TRACT | 0 refills | Status: DC | PRN

## 2023-04-17 MED ORDER — CEPHALEXIN 500 MG PO CAPS: 500.0000 mg | ORAL_CAPSULE | Freq: Four times a day (QID) | ORAL | 0 refills | Status: AC

## 2023-06-08 ENCOUNTER — Encounter: Payer: Self-pay | Admitting: Family

## 2023-06-18 ENCOUNTER — Encounter: Payer: Self-pay | Admitting: Family

## 2023-08-17 LAB — OB RESULTS CONSOLE GBS: GBS: POSITIVE

## 2023-08-24 ENCOUNTER — Ambulatory Visit (INDEPENDENT_AMBULATORY_CARE_PROVIDER_SITE_OTHER): Admitting: Family

## 2023-08-24 ENCOUNTER — Encounter: Payer: Self-pay | Admitting: Family

## 2023-08-24 VITALS — BP 115/62 | HR 96 | Ht 66.0 in | Wt 206.2 lb

## 2023-08-24 DIAGNOSIS — L819 Disorder of pigmentation, unspecified: Secondary | ICD-10-CM | POA: Diagnosis not present

## 2023-08-24 DIAGNOSIS — Z0130 Encounter for examination of blood pressure without abnormal findings: Secondary | ICD-10-CM

## 2023-09-02 ENCOUNTER — Other Ambulatory Visit: Payer: Self-pay | Admitting: Obstetrics and Gynecology

## 2023-09-02 DIAGNOSIS — Z3490 Encounter for supervision of normal pregnancy, unspecified, unspecified trimester: Secondary | ICD-10-CM

## 2023-09-02 NOTE — Progress Notes (Signed)
 OB History & Physical   History of Present Illness:  Chief Complaint:   HPI:  Summer Webb is a 23 y.o. G1P0 female at [redacted]w[redacted]d dated by :MP.  She presents to L&D for an elective IOL.  She reports:  -active fetal movement -no leakage of fluid  -no vaginal bleeding -no contractions  Pregnancy Issues: 1. Anemia 2. GBS positive 3. Anxiety    Maternal Medical History:   Past Medical History:  Diagnosis Date   ADD (attention deficit disorder)     Past Surgical History:  Procedure Laterality Date   WISDOM TOOTH EXTRACTION      No Known Allergies  Prior to Admission medications   Medication Sig Start Date End Date Taking? Authorizing Provider  albuterol  (VENTOLIN  HFA) 108 (90 Base) MCG/ACT inhaler Inhale 2 puffs into the lungs every 4 (four) hours as needed for wheezing or shortness of breath. 04/17/23   Trenda Frisk, FNP  famotidine-calcium carbonate-magnesium hydroxide (PEPCID COMPLETE) 10-800-165 MG chewable tablet Chew 1 tablet by mouth daily as needed.    [provider]  fexofenadine  (ALLEGRA ) 180 MG tablet Take 1 tablet (180 mg total) by mouth daily. 04/01/21   Laurence Pons, NP  fluticasone  (FLONASE ) 50 MCG/ACT nasal spray Place 2 sprays into both nostrils daily. 03/17/20   Wayna Hails, NP  guaiFENesin  (MUCINEX ) 600 MG 12 hr tablet Take 1 tablet (600 mg total) by mouth 2 (two) times daily. Patient not taking: Reported on 08/24/2023 04/01/21   Abernathy, Alyssa, NP  ibuprofen  (ADVIL ) 800 MG tablet Take 1 tablet (800 mg total) by mouth every 8 (eight) hours as needed (pain). Patient not taking: Reported on 08/24/2023 09/28/21   Ann Keto, MD  LINZESS  72 MCG capsule TAKE 1 CAPSULE(72 MCG) BY MOUTH DAILY BEFORE BREAKFAST 03/09/23   Trenda Frisk, FNP  montelukast  (SINGULAIR ) 10 MG tablet Take 1 tablet (10 mg total) by mouth at bedtime. Patient not taking: Reported on 08/24/2023 02/18/21   Khan, Fozia M, MD  Prenatal Vit-Fe Fumarate-FA (PRENATAL  MULTIVITAMIN) TABS tablet Take 1 tablet by mouth daily at 12 noon.    [provider]  senna-docusate (SENOKOT-S) 8.6-50 MG tablet Take 1 tablet by mouth daily.    [provider]     Prenatal care site: [facility]    Social History: She  reports that she has never smoked. She has never used smokeless tobacco. She reports current alcohol use. She reports that she does not use drugs.  Family History: family history includes Diabetes in her father; Hypertension in her mother.   Review of Systems: A full review of systems was performed and negative except as noted in the HPI.    Physical Exam:  Vital Signs: There were no vitals taken for this visit.  General:   alert and cooperative  Skin:  normal  Neurologic:    Alert & oriented x 3  Lungs:   Nl effort  Heart:   regular rate and rhythm  Abdomen:  soft, non-tender; bowel sounds normal; no masses,  no organomegaly  Extremities: : non-tender, symmetric, no edema bilaterally.       Pertinent Results:  Prenatal Labs: Blood type/Rh A pos  Antibody screen neg  Rubella Immune  Varicella Immune  RPR NR  HBsAg Neg  HIV NR  GC neg  Chlamydia neg  Genetic screening declined  1 hour GTT 83  3 hour GTT   GBS pos   FHT:  Category/reactivity:   TOCO:  SVE:   /   /  Assessment:  Summer Webb is a 23 y.o. G1P0 female at [redacted]w[redacted]d here for IOL.   Plan:  1. Admit to Labor & Delivery; consents reviewed and obtained  2. Fetal Well being  - Fetal Tracing:  - GBS pos - Presentation:  confirmed by    3. Routine OB: - Prenatal labs reviewed, as above - Rh pos - CBC & T&S on admit - Clear fluids, IVF  4. Monitoring of Labor -  Contractions by external toco in place -  Plan for induction with cytotec -  Plan for continuous fetal monitoring  -  Maternal pain control as desired: IVPM, nitrous, regional anesthesia - Anticipate vaginal delivery  5. Post Partum Planning: - Infant feeding:  Breastfeeding - Contraception: Liletta IUD - Tdap: given 06/29/23  - Flu: declined 02/2023 - RSV: was out of season  Collin Deal, [place] 09/02/2023 11:42 PM

## 2023-09-03 ENCOUNTER — Inpatient Hospital Stay
Admission: EM | Admit: 2023-09-03 | Discharge: 2023-09-06 | DRG: 787 | Disposition: A | Discharge status: Home / Self Care | Attending: Obstetrics | Admitting: Obstetrics

## 2023-09-03 ENCOUNTER — Inpatient Hospital Stay: Admitting: Anesthesiology

## 2023-09-03 ENCOUNTER — Other Ambulatory Visit: Payer: Self-pay

## 2023-09-03 ENCOUNTER — Encounter: Payer: Self-pay | Admitting: *Deleted

## 2023-09-03 DIAGNOSIS — O9081 Anemia of the puerperium: Secondary | ICD-10-CM | POA: Diagnosis not present

## 2023-09-03 DIAGNOSIS — O99824 Streptococcus B carrier state complicating childbirth: Secondary | ICD-10-CM | POA: Diagnosis present

## 2023-09-03 DIAGNOSIS — D62 Acute posthemorrhagic anemia: Secondary | ICD-10-CM | POA: Diagnosis not present

## 2023-09-03 DIAGNOSIS — Z3A39 39 weeks gestation of pregnancy: Secondary | ICD-10-CM

## 2023-09-03 DIAGNOSIS — Z8249 Family history of ischemic heart disease and other diseases of the circulatory system: Secondary | ICD-10-CM | POA: Diagnosis not present

## 2023-09-03 DIAGNOSIS — O26893 Other specified pregnancy related conditions, third trimester: Secondary | ICD-10-CM | POA: Diagnosis present

## 2023-09-03 DIAGNOSIS — Z3490 Encounter for supervision of normal pregnancy, unspecified, unspecified trimester: Principal | ICD-10-CM | POA: Diagnosis present

## 2023-09-03 DIAGNOSIS — Z833 Family history of diabetes mellitus: Secondary | ICD-10-CM

## 2023-09-03 LAB — CBC
HCT: 32.2 % — ABNORMAL LOW (ref 36.0–46.0)
Hemoglobin: 10.9 g/dL — ABNORMAL LOW (ref 12.0–15.0)
MCH: 31.1 pg (ref 26.0–34.0)
MCHC: 33.9 g/dL (ref 30.0–36.0)
MCV: 92 fL (ref 80.0–100.0)
Platelets: 224 10*3/uL (ref 150–400)
RBC: 3.5 MIL/uL — ABNORMAL LOW (ref 3.87–5.11)
RDW: 13 % (ref 11.5–15.5)
WBC: 10.4 10*3/uL (ref 4.0–10.5)
nRBC: 0 % (ref 0.0–0.2)

## 2023-09-03 LAB — TYPE AND SCREEN
ABO/RH(D): A POS
Antibody Screen: NEGATIVE

## 2023-09-03 LAB — RPR: RPR Ser Ql: NONREACTIVE

## 2023-09-03 LAB — ABO/RH: ABO/RH(D): A POS

## 2023-09-03 MED ORDER — PENICILLIN G POT IN DEXTROSE 60000 UNIT/ML IV SOLN
3.0000 10*6.[IU] | INTRAVENOUS | Status: DC
  Administered 2023-09-03 – 2023-09-04 (×4): 3 10*6.[IU] via INTRAVENOUS
  Filled 2023-09-03 (×4): qty 50

## 2023-09-03 MED ORDER — OXYTOCIN-SODIUM CHLORIDE 30-0.9 UT/500ML-% IV SOLN
1.0000 m[IU]/min | INTRAVENOUS | Status: DC
  Administered 2023-09-03: 2 m[IU]/min via INTRAVENOUS

## 2023-09-03 MED ORDER — BUPIVACAINE HCL (PF) 0.25 % IJ SOLN
INTRAMUSCULAR | Status: DC | PRN
  Administered 2023-09-03 (×3): 4 mL via EPIDURAL
  Administered 2023-09-03: 3 mL via EPIDURAL

## 2023-09-03 MED ORDER — LIDOCAINE HCL (PF) 1 % IJ SOLN
INTRAMUSCULAR | Status: DC | PRN
  Administered 2023-09-03 (×2): 3 mL via SUBCUTANEOUS
  Administered 2023-09-03: 2 mL via SUBCUTANEOUS

## 2023-09-03 MED ORDER — FENTANYL-BUPIVACAINE-NACL 0.5-0.125-0.9 MG/250ML-% EP SOLN
EPIDURAL | Status: AC
  Filled 2023-09-03: qty 250

## 2023-09-03 MED ORDER — LIDOCAINE HCL (PF) 1 % IJ SOLN
30.0000 mL | INTRAMUSCULAR | Status: DC | PRN
  Filled 2023-09-03: qty 30

## 2023-09-03 MED ORDER — CALCIUM CARBONATE ANTACID 500 MG PO CHEW
2.0000 | CHEWABLE_TABLET | Freq: Once | ORAL | Status: AC
  Administered 2023-09-03: 400 mg via ORAL
  Filled 2023-09-03: qty 2

## 2023-09-03 MED ORDER — EPHEDRINE 5 MG/ML INJ: 10.0000 mg | INTRAVENOUS | Status: DC | PRN

## 2023-09-03 MED ORDER — OXYTOCIN BOLUS FROM INFUSION: 333.0000 mL | Freq: Once | INTRAVENOUS | Status: DC

## 2023-09-03 MED ORDER — LIDOCAINE-EPINEPHRINE (PF) 1.5 %-1:200000 IJ SOLN
INTRAMUSCULAR | Status: DC | PRN
Start: 2023-09-03 — End: 2023-09-04
  Administered 2023-09-03 (×2): 3 mL via EPIDURAL

## 2023-09-03 MED ORDER — PHENYLEPHRINE 80 MCG/ML (10ML) SYRINGE FOR IV PUSH (FOR BLOOD PRESSURE SUPPORT): 80.0000 ug | PREFILLED_SYRINGE | INTRAVENOUS | Status: DC | PRN

## 2023-09-03 MED ORDER — SODIUM CHLORIDE 0.9 % IV SOLN
5.0000 10*6.[IU] | Freq: Once | INTRAVENOUS | Status: AC
  Administered 2023-09-03: 5 10*6.[IU] via INTRAVENOUS
  Filled 2023-09-03: qty 5

## 2023-09-03 MED ORDER — PROPRANOLOL HCL 1 MG/ML IV SOLN
2.0000 mg | Freq: Once | INTRAVENOUS | Status: AC
Start: 2023-09-03 — End: 2023-09-03
  Administered 2023-09-03: 2 mg via INTRAVENOUS
  Filled 2023-09-03: qty 2

## 2023-09-03 MED ORDER — OXYTOCIN 10 UNIT/ML IJ SOLN
INTRAMUSCULAR | Status: AC
  Filled 2023-09-03: qty 2

## 2023-09-03 MED ORDER — ONDANSETRON HCL 4 MG/2ML IJ SOLN
4.0000 mg | Freq: Four times a day (QID) | INTRAMUSCULAR | Status: DC | PRN
  Administered 2023-09-03 – 2023-09-04 (×2): 4 mg via INTRAVENOUS
  Filled 2023-09-03 (×2): qty 2

## 2023-09-03 MED ORDER — FENTANYL-BUPIVACAINE-NACL 0.5-0.125-0.9 MG/250ML-% EP SOLN
EPIDURAL | Status: DC | PRN
  Administered 2023-09-03 (×2): 12 mL/h via EPIDURAL

## 2023-09-03 MED ORDER — ACETAMINOPHEN 325 MG PO TABS: 650.0000 mg | ORAL_TABLET | ORAL | Status: DC | PRN

## 2023-09-03 MED ORDER — LACTATED RINGERS IV SOLN: 500.0000 mL | INTRAVENOUS | Status: AC | PRN

## 2023-09-03 MED ORDER — DIPHENHYDRAMINE HCL 50 MG/ML IJ SOLN
50.0000 mg | Freq: Once | INTRAMUSCULAR | Status: AC
  Administered 2023-09-03: 50 mg via INTRAVENOUS
  Filled 2023-09-03: qty 1

## 2023-09-03 MED ORDER — FENTANYL CITRATE (PF) 100 MCG/2ML IJ SOLN
50.0000 ug | INTRAMUSCULAR | Status: DC | PRN
Start: 2023-09-03 — End: 2023-09-04
  Administered 2023-09-03: 50 ug via INTRAVENOUS
  Filled 2023-09-03: qty 2

## 2023-09-03 MED ORDER — LACTATED RINGERS IV SOLN
500.0000 mL | Freq: Once | INTRAVENOUS | Status: AC
  Administered 2023-09-03: 500 mL via INTRAVENOUS

## 2023-09-03 MED ORDER — OXYTOCIN-SODIUM CHLORIDE 30-0.9 UT/500ML-% IV SOLN
2.5000 [IU]/h | INTRAVENOUS | Status: DC
  Administered 2023-09-04: 30 [IU] via INTRAVENOUS
  Filled 2023-09-03 (×2): qty 500

## 2023-09-03 MED ORDER — MISOPROSTOL 200 MCG PO TABS
ORAL_TABLET | ORAL | Status: AC
  Filled 2023-09-03: qty 4

## 2023-09-03 MED ORDER — MISOPROSTOL 25 MCG QUARTER TABLET
25.0000 ug | ORAL_TABLET | ORAL | Status: AC
  Administered 2023-09-03 (×2): 25 ug via ORAL
  Filled 2023-09-03 (×3): qty 1

## 2023-09-03 MED ORDER — OXYCODONE-ACETAMINOPHEN 5-325 MG PO TABS: 2.0000 | ORAL_TABLET | ORAL | Status: DC | PRN

## 2023-09-03 MED ORDER — FENTANYL-BUPIVACAINE-NACL 0.5-0.125-0.9 MG/250ML-% EP SOLN: 12.0000 mL/h | EPIDURAL | Status: DC | PRN

## 2023-09-03 MED ORDER — OXYCODONE-ACETAMINOPHEN 5-325 MG PO TABS: 1.0000 | ORAL_TABLET | ORAL | Status: DC | PRN

## 2023-09-03 MED ORDER — MISOPROSTOL 25 MCG QUARTER TABLET
25.0000 ug | ORAL_TABLET | ORAL | Status: AC
  Administered 2023-09-03 (×2): 25 ug via VAGINAL
  Filled 2023-09-03 (×3): qty 1

## 2023-09-03 MED ORDER — SOD CITRATE-CITRIC ACID 500-334 MG/5ML PO SOLN: 30.0000 mL | ORAL | Status: DC | PRN

## 2023-09-03 MED ORDER — DIPHENHYDRAMINE HCL 50 MG/ML IJ SOLN: 12.5000 mg | INTRAMUSCULAR | Status: DC | PRN

## 2023-09-03 MED ORDER — LACTATED RINGERS IV SOLN: INTRAVENOUS | Status: AC

## 2023-09-03 MED ORDER — TERBUTALINE SULFATE 1 MG/ML IJ SOLN: 0.2500 mg | Freq: Once | INTRAMUSCULAR | Status: DC | PRN

## 2023-09-03 NOTE — Progress Notes (Signed)
 L&D Note    Subjective:  Resting in bed with family at bedside  Objective:      09/03/2023    9:35 PM 09/03/2023    9:30 PM 09/03/2023    9:25 PM  Vitals with BMI  Systolic 114 108 99  Diastolic 62 58 63  Pulse 94 89 94     Gen: alert, cooperative, no distress FHR: Baseline: 130 bpm, Variability: moderate, Accels: Present, Decels: none Toco: regular, every 2-3 minutes SVE: Dilation: 7 Effacement (%): 70 Cervical Position: Middle Station: -3 Presentation: Vertex Exam by:: D Burns, RN  Medications SCHEDULED MEDICATIONS   misoprostol       oxytocin       oxytocin 40 units in LR 1000 mL  333 mL Intravenous Once    MEDICATION INFUSIONS   fentaNYL 2 mcg/mL w/bupivacaine 0.125% in NS 250 mL     lactated ringers     lactated ringers 125 mL/hr at 09/03/23 1820   oxytocin     oxytocin 20 milli-units/min (09/03/23 2059)   pencillin G potassium IV 3 Million Units (09/03/23 2017)    PRN MEDICATIONS  acetaminophen , diphenhydrAMINE, ePHEDrine, ePHEDrine, fentaNYL (SUBLIMAZE) injection, fentaNYL 2 mcg/mL w/bupivacaine 0.125% in NS 250 mL, lactated ringers, lidocaine (PF), misoprostol, ondansetron, oxyCODONE-acetaminophen , oxyCODONE-acetaminophen , oxytocin, phenylephrine, phenylephrine, sodium citrate-citric acid, terbutaline   Assessment & Plan:  23 y.o. G1P0 at [redacted]w[redacted]d admitted for Labor_induction_indication: Elective at term -GBS: positive -IP Antibiotics: abx: PCN x3 -Membranes ruptured, clear fluid -Recheck:Evaluated by digital exam. -Preeclampsia:  labs stable -Pain: none -Intervention: continue present management, increase Pitocin rate, change maternal position, anticipate vaginal delivery, administer Benadryl and Tums,and propanolol for prolonged first stage of labor. -Pe_uterus_labor: Pitocin at 20 mu/min. and Adequate relaxation between contractions. -Analgesia: regional anesthesia   Jaiden Dinkins, CNM  09/03/2023 9:54 PM  [facility]

## 2023-09-03 NOTE — Anesthesia Preprocedure Evaluation (Signed)
 Anesthesia Evaluation  Patient identified by MRN, date of birth, ID band Patient awake    Reviewed: Allergy & Precautions, H&P , NPO status , Patient's Chart, lab work & pertinent test results  Airway Mallampati: II       Dental no notable dental hx. (+) Teeth Intact   Pulmonary    Pulmonary exam normal        Cardiovascular hypertension, Normal cardiovascular exam     Neuro/Psych  PSYCHIATRIC DISORDERS         GI/Hepatic negative GI ROS, Neg liver ROS,,,  Endo/Other  negative endocrine ROS    Renal/GU negative Renal ROS     Musculoskeletal   Abdominal   Peds  Hematology negative hematology ROS (+)   Anesthesia Other Findings   Reproductive/Obstetrics                             Anesthesia Physical Anesthesia Plan  ASA: 2  Anesthesia Plan: Epidural   Post-op Pain Management:    Induction:   PONV Risk Score and Plan:   Airway Management Planned:   Additional Equipment:   Intra-op Plan:   Post-operative Plan:   Informed Consent: I have reviewed the patients History and Physical, chart, labs and discussed the procedure including the risks, benefits and alternatives for the proposed anesthesia with the patient or authorized representative who has indicated his/her understanding and acceptance.     Dental Advisory Given  Plan Discussed with: Anesthesiologist and CRNA  Anesthesia Plan Comments:        Anesthesia Quick Evaluation

## 2023-09-03 NOTE — Progress Notes (Signed)
 L&D Note    Subjective:  Resting in bed with family at bedside  Objective:      09/03/2023   11:42 AM 09/03/2023    7:15 AM 09/03/2023    6:17 AM  Vitals with BMI  Systolic 123 116 161  Diastolic 71 65 67  Pulse 74 78 78     Gen: alert, cooperative, no distress FHR: Baseline: 135 bpm, Variability: moderate, Accels: Present, Decels: none Toco: irregular, every 2-5 minutes SVE: Dilation: 1 Effacement (%): 60 Cervical Position: Posterior Station: -3 Presentation: Vertex Exam by:: Marcille Severance CNM  Medications SCHEDULED MEDICATIONS   misoprostol  25 mcg Oral Q4H   And   misoprostol  25 mcg Vaginal Q4H   oxytocin 40 units in LR 1000 mL  333 mL Intravenous Once    MEDICATION INFUSIONS   lactated ringers     lactated ringers 125 mL/hr at 09/03/23 1131   oxytocin     oxytocin 4 milli-units/min (09/03/23 1243)   pencillin G potassium IV      PRN MEDICATIONS  acetaminophen , fentaNYL (SUBLIMAZE) injection, lactated ringers, lidocaine (PF), ondansetron, oxyCODONE-acetaminophen , oxyCODONE-acetaminophen , sodium citrate-citric acid, terbutaline   Assessment & Plan:  23 y.o. G1P0 at [redacted]w[redacted]d admitted for Labor_induction_indication: Elective at term -GBS: positive -IP Antibiotics: abx: PCN x 1 -Membranes intact -Recheck:Evaluated by digital exam. -Preeclampsia:  labs stable -Pain: receiving treatment -Intervention: IV Pitocin augmentation, increase Pitocin rate, anticipate vaginal delivery, and cooke cervical catheter placed, cervical and vaginal balloon inflated with 80 cc -Analgesia: IVPM   Luverne Farone, CNM  09/03/2023 1:49 PM  [facility]

## 2023-09-03 NOTE — Anesthesia Procedure Notes (Signed)
 Epidural Patient location during procedure: OB Start time: 09/03/2023 4:42 PM End time: 09/03/2023 4:54 PM  Staffing Anesthesiologist: Piscitello, Portia Brittle, MD Resident/CRNA: Manya Sells, CRNA Performed: anesthesiologist   Preanesthetic Checklist Completed: patient identified, IV checked, site marked, risks and benefits discussed, surgical consent, monitors and equipment checked, pre-op evaluation and timeout performed  Epidural Patient position: sitting Prep: ChloraPrep Patient monitoring: heart rate, continuous pulse ox and blood pressure Approach: midline Location: L3-L4 Injection technique: LOR air  Needle:  Needle type: Tuohy  Needle gauge: 17 G Needle length: 9 cm and 9 Needle insertion depth: 7 cm Catheter type: closed end flexible Catheter size: 19 Gauge Catheter at skin depth: 12 cm Test dose: negative and 1.5% lidocaine with Epi 1:200 K  Assessment Sensory level: T10 Events: blood not aspirated, no cerebrospinal fluid, injection not painful, no injection resistance, no paresthesia and negative IV test  Additional Notes 1 attempt Pt. Evaluated and documentation done after procedure finished. Patient identified. Risks/Benefits/Options discussed with patient including but not limited to bleeding, infection, nerve damage, paralysis, failed block, incomplete pain control, headache, blood pressure changes, nausea, vomiting, reactions to medication both or allergic, itching and postpartum back pain. Confirmed with bedside nurse the patient's most recent platelet count. Confirmed with patient that they are not currently taking any anticoagulation, have any bleeding history or any family history of bleeding disorders. Patient expressed understanding and wished to proceed. All questions were answered. Sterile technique was used throughout the entire procedure. Please see nursing notes for vital signs. Test dose was given through epidural catheter and negative prior to  continuing to dose epidural or start infusion. Warning signs of high block given to the patient including shortness of breath, tingling/numbness in hands, complete motor block, or any concerning symptoms with instructions to call for help. Patient was given instructions on fall risk and not to get out of bed. All questions and concerns addressed with instructions to call with any issues or inadequate analgesia.    Patient tolerated the insertion well without immediate complications.Reason for block:procedure for pain

## 2023-09-03 NOTE — Progress Notes (Signed)
 OB History & Physical   History of Present Illness:  Chief Complaint:   HPI:  Summer Webb is a 23 y.o. G1P0 female at [redacted]w[redacted]d dated by :MP.  She presents to L&D for an elective IOL.  She reports:  -active fetal movement -no leakage of fluid  -no vaginal bleeding -no contractions  Pregnancy Issues: 1. Anemia 2. GBS positive 3. Anxiety    Maternal Medical History:   Past Medical History:  Diagnosis Date   ADD (attention deficit disorder)     Past Surgical History:  Procedure Laterality Date   WISDOM TOOTH EXTRACTION      No Known Allergies  Prior to Admission medications   Medication Sig Start Date End Date Taking? Authorizing Provider  albuterol  (VENTOLIN  HFA) 108 (90 Base) MCG/ACT inhaler Inhale 2 puffs into the lungs every 4 (four) hours as needed for wheezing or shortness of breath. 04/17/23   Trenda Frisk, FNP  famotidine-calcium carbonate-magnesium hydroxide (PEPCID COMPLETE) 10-800-165 MG chewable tablet Chew 1 tablet by mouth daily as needed.    [provider]  fexofenadine  (ALLEGRA ) 180 MG tablet Take 1 tablet (180 mg total) by mouth daily. 04/01/21   Abernathy, Alyssa, NP  fluticasone  (FLONASE ) 50 MCG/ACT nasal spray Place 2 sprays into both nostrils daily. 03/17/20   Wayna Hails, NP  guaiFENesin  (MUCINEX ) 600 MG 12 hr tablet Take 1 tablet (600 mg total) by mouth 2 (two) times daily. Patient not taking: Reported on 08/24/2023 04/01/21   Laurence Pons, NP  ibuprofen  (ADVIL ) 800 MG tablet Take 1 tablet (800 mg total) by mouth every 8 (eight) hours as needed (pain). Patient not taking: Reported on 08/24/2023 09/28/21   Ann Keto, MD  LINZESS  72 MCG capsule TAKE 1 CAPSULE(72 MCG) BY MOUTH DAILY BEFORE BREAKFAST 03/09/23   Trenda Frisk, FNP  montelukast  (SINGULAIR ) 10 MG tablet Take 1 tablet (10 mg total) by mouth at bedtime. Patient not taking: Reported on 08/24/2023 02/18/21   Khan, Fozia M, MD  Prenatal Vit-Fe Fumarate-FA (PRENATAL  MULTIVITAMIN) TABS tablet Take 1 tablet by mouth daily at 12 noon.    [provider]  senna-docusate (SENOKOT-S) 8.6-50 MG tablet Take 1 tablet by mouth daily.    [provider]     Prenatal care site: [facility]    Social History: She  reports that she has never smoked. She has never used smokeless tobacco. She reports current alcohol use. She reports that she does not use drugs.  Family History: family history includes Diabetes in her father; Hypertension in her mother.   Review of Systems: A full review of systems was performed and negative except as noted in the HPI.    Physical Exam:  Vital Signs: BP 123/71   Pulse 74   Temp 98.4 F (36.9 C) (Oral)   Resp 15   Ht 5\' 6"  (1.676 m)   Wt 93.9 kg   BMI 33.41 kg/m   General:   alert and cooperative  Skin:  normal  Neurologic:    Alert & oriented x 3  Lungs:   Nl effort  Heart:   regular rate and rhythm  Abdomen:  soft, non-tender; bowel sounds normal; no masses,  no organomegaly  Extremities: : non-tender, symmetric, no edema bilaterally.       Pertinent Results:  Prenatal Labs: Blood type/Rh A pos  Antibody screen neg  Rubella Immune  Varicella Immune  RPR NR  HBsAg Neg  HIV NR  GC neg  Chlamydia  neg  Genetic screening declined  1 hour GTT 83  3 hour GTT   GBS pos   FHT: 135 Category/reactivity:  category 1 TOCO: every 6-7 minutes SVE: Dilation: 1 / Effacement (%): 60 / Station: -3      Assessment:  Summer Webb is a 23 y.o. G1P0 female at [redacted]w[redacted]d here for IOL.   Plan:  1. Admit to Labor & Delivery; consents reviewed and obtained  2. Fetal Well being  - Fetal Tracing:  - GBS pos - Presentation:  confirmed by    3. Routine OB: - Prenatal labs reviewed, as above - Rh pos - CBC & T&S on admit - Clear fluids, IVF  4. Monitoring of Labor -  Contractions by external toco in place -  Plan for induction with cytotec -  Plan for continuous fetal monitoring  -  Maternal  pain control as desired: IVPM, nitrous, regional anesthesia - Anticipate vaginal delivery   5. Post Partum Planning: - Infant feeding: Breastfeeding - Contraception: Liletta IUD - Tdap: given 06/29/23  - Flu: declined 02/2023 - RSV: was out of season  Arzella Laurence CNM 09/03/2023 1:46 PM

## 2023-09-03 NOTE — Anesthesia Procedure Notes (Signed)
 Epidural Patient location during procedure: OB Start time: 09/03/2023 11:13 PM End time: 09/03/2023 11:27 PM  Staffing Anesthesiologist: Sopheap Basic, Portia Brittle, MD Performed: anesthesiologist   Preanesthetic Checklist Completed: patient identified, IV checked, site marked, risks and benefits discussed, surgical consent, monitors and equipment checked, pre-op evaluation and timeout performed  Epidural Patient position: sitting Prep: ChloraPrep Patient monitoring: heart rate, continuous pulse ox and blood pressure Approach: midline Location: L3-L4 Injection technique: LOR saline  Needle:  Needle type: Tuohy  Needle gauge: 17 G Needle length: 9 cm and 9 Needle insertion depth: 7 cm Catheter type: closed end flexible Catheter size: 19 Gauge Catheter at skin depth: 12 cm Test dose: negative and 1.5% lidocaine with Epi 1:200 K  Assessment Sensory level: T10 Events: blood aspirated, no cerebrospinal fluid, injection not painful, no injection resistance, no paresthesia and negative IV test  Additional Notes Epidural replacement: 2 attempts. First catheter placement looked intravascular with frank heme aspirated before test dose so catheter was removed. New kit and catheter used for 2nd attempt. 2nd attempt was normal, no heme or CSF with aspiration and negative test dose. Pt. Evaluated and documentation done after procedure finished. Patient identified. Risks/Benefits/Options discussed with patient including but not limited to bleeding, infection, nerve damage, paralysis, failed block, incomplete pain control, headache, blood pressure changes, nausea, vomiting, reactions to medication both or allergic, itching and postpartum back pain. Confirmed with bedside nurse the patient's most recent platelet count. Confirmed with patient that they are not currently taking any anticoagulation, have any bleeding history or any family history of bleeding disorders. Patient expressed understanding and  wished to proceed. All questions were answered. Sterile technique was used throughout the entire procedure. Please see nursing notes for vital signs. Test dose was given through epidural catheter and negative prior to continuing to dose epidural or start infusion. Warning signs of high block given to the patient including shortness of breath, tingling/numbness in hands, complete motor block, or any concerning symptoms with instructions to call for help. Patient was given instructions on fall risk and not to get out of bed. All questions and concerns addressed with instructions to call with any issues or inadequate analgesia.    Patient tolerated the insertion well without immediate complications.Reason for block:procedure for pain

## 2023-09-03 NOTE — Progress Notes (Signed)
 L&D Note    Subjective:  Resting in bed with family at bedside, requesting an epidural  Objective:      09/03/2023    3:17 PM 09/03/2023   11:42 AM 09/03/2023    7:15 AM  Vitals with BMI  Systolic 119 123 161  Diastolic 63 71 65  Pulse 73 74 78     Gen: alert, cooperative, no distress FHR: Baseline: 130 bpm, Variability: moderate, Accels: Present, Decels: none Toco: irregular, every 1.5-5 minutes SVE: Dilation: 6 Effacement (%): 60 Cervical Position: Posterior Station: -3 Presentation: Vertex Exam by:: Marcille Severance CNM  Medications SCHEDULED MEDICATIONS   oxytocin 40 units in LR 1000 mL  333 mL Intravenous Once    MEDICATION INFUSIONS   lactated ringers     lactated ringers 125 mL/hr at 09/03/23 1131   oxytocin     oxytocin 10 milli-units/min (09/03/23 1555)   pencillin G potassium IV 3 Million Units (09/03/23 1614)    PRN MEDICATIONS  acetaminophen , fentaNYL (SUBLIMAZE) injection, lactated ringers, lidocaine (PF), ondansetron, oxyCODONE-acetaminophen , oxyCODONE-acetaminophen , sodium citrate-citric acid, terbutaline   Assessment & Plan:  23 y.o. G1P0 at [redacted]w[redacted]d admitted for Labor_induction_indication: Elective at term -GBS: positive -IP Antibiotics: abx: PCN x2 -Membranes ruptured, clear fluid -Recheck:Evaluated by digital exam. -Preeclampsia:  labs stable -Pain: level of pain (1-10, 10 severe), 10/10 and requesting an epidural -Intervention: continue present management, IV Pitocin augmentation, increase Pitocin rate, change maternal position, and anticipate vaginal delivery -Pe_uterus_labor: Pitocin at 6 mu/min. and Adequate relaxation between contractions. -Analgesia: regional anesthesia and anesthesia notified  Dr Eustace Highland notified of labor events  Chynna Buerkle, CNM  09/03/2023 4:22 PM  [facility]

## 2023-09-04 ENCOUNTER — Encounter
Admission: EM | Disposition: A | Discharge status: Home / Self Care | Payer: Self-pay | Source: Home / Self Care | Attending: Obstetrics

## 2023-09-04 ENCOUNTER — Encounter: Payer: Self-pay | Admitting: Obstetrics and Gynecology

## 2023-09-04 LAB — CREATININE, SERUM
Creatinine, Ser: 0.59 mg/dL (ref 0.44–1.00)
GFR, Estimated: >60 mL/min (ref >60)

## 2023-09-04 LAB — CBC
HCT: 32.7 % — ABNORMAL LOW (ref 36.0–46.0)
Hemoglobin: 10.8 g/dL — ABNORMAL LOW (ref 12.0–15.0)
MCH: 30.8 pg (ref 26.0–34.0)
MCHC: 33 g/dL (ref 30.0–36.0)
MCV: 93.2 fL (ref 80.0–100.0)
Platelets: 212 10*3/uL (ref 150–400)
RBC: 3.51 MIL/uL — ABNORMAL LOW (ref 3.87–5.11)
RDW: 13.2 % (ref 11.5–15.5)
WBC: 17.7 10*3/uL — ABNORMAL HIGH (ref 4.0–10.5)
nRBC: 0 % (ref 0.0–0.2)

## 2023-09-04 SURGERY — Surgical Case
Anesthesia: Choice

## 2023-09-04 SURGERY — Surgical Case
Anesthesia: Epidural

## 2023-09-04 MED ORDER — DIPHENHYDRAMINE HCL 50 MG/ML IJ SOLN: 12.5000 mg | INTRAMUSCULAR | Status: DC | PRN

## 2023-09-04 MED ORDER — METHYLERGONOVINE MALEATE 0.2 MG/ML IJ SOLN
INTRAMUSCULAR | Status: DC | PRN
  Administered 2023-09-04: .2 mg via INTRAMUSCULAR

## 2023-09-04 MED ORDER — KETOROLAC TROMETHAMINE 30 MG/ML IJ SOLN: 30.0000 mg | Freq: Four times a day (QID) | INTRAMUSCULAR | Status: DC

## 2023-09-04 MED ORDER — SOD CITRATE-CITRIC ACID 500-334 MG/5ML PO SOLN: 30.0000 mL | ORAL | Status: AC

## 2023-09-04 MED ORDER — MORPHINE SULFATE (PF) 0.5 MG/ML IJ SOLN
INTRAMUSCULAR | Status: DC | PRN
Start: 2023-09-04 — End: 2023-09-04
  Administered 2023-09-04: 3 mg via EPIDURAL

## 2023-09-04 MED ORDER — DIPHENHYDRAMINE HCL 25 MG PO CAPS: 25.0000 mg | ORAL_CAPSULE | ORAL | Status: DC | PRN

## 2023-09-04 MED ORDER — SODIUM CHLORIDE 0.9% FLUSH: 3.0000 mL | INTRAVENOUS | Status: DC | PRN

## 2023-09-04 MED ORDER — OXYTOCIN-SODIUM CHLORIDE 30-0.9 UT/500ML-% IV SOLN
INTRAVENOUS | Status: AC
  Administered 2023-09-04: 2.5 [IU]/h via INTRAVENOUS
  Filled 2023-09-04: qty 500

## 2023-09-04 MED ORDER — KETOROLAC TROMETHAMINE 30 MG/ML IJ SOLN
30.0000 mg | Freq: Four times a day (QID) | INTRAMUSCULAR | Status: AC
Start: 2023-09-04 — End: 2023-09-05
  Administered 2023-09-04 – 2023-09-05 (×3): 30 mg via INTRAVENOUS
  Filled 2023-09-04 (×3): qty 1

## 2023-09-04 MED ORDER — MENTHOL 3 MG MT LOZG: 1.0000 | LOZENGE | OROMUCOSAL | Status: DC | PRN

## 2023-09-04 MED ORDER — SOD CITRATE-CITRIC ACID 500-334 MG/5ML PO SOLN
ORAL | Status: AC
  Administered 2023-09-04: 30 mL via ORAL
  Filled 2023-09-04: qty 15

## 2023-09-04 MED ORDER — SIMETHICONE 80 MG PO CHEW: 80.0000 mg | CHEWABLE_TABLET | ORAL | Status: DC | PRN

## 2023-09-04 MED ORDER — LIDOCAINE HCL (PF) 2 % IJ SOLN
INTRAMUSCULAR | Status: AC
  Filled 2023-09-04: qty 20

## 2023-09-04 MED ORDER — NALOXONE HCL 4 MG/10ML IJ SOLN: 1.0000 ug/kg/h | INTRAVENOUS | Status: DC | PRN

## 2023-09-04 MED ORDER — ZOLPIDEM TARTRATE 5 MG PO TABS: 5.0000 mg | ORAL_TABLET | Freq: Every evening | ORAL | Status: DC | PRN

## 2023-09-04 MED ORDER — SENNOSIDES-DOCUSATE SODIUM 8.6-50 MG PO TABS
2.0000 | ORAL_TABLET | Freq: Every day | ORAL | Status: DC
  Administered 2023-09-05 – 2023-09-06 (×2): 2 via ORAL
  Filled 2023-09-04 (×2): qty 2

## 2023-09-04 MED ORDER — ACETAMINOPHEN 500 MG PO TABS: 1000.0000 mg | ORAL_TABLET | Freq: Once | ORAL | Status: AC

## 2023-09-04 MED ORDER — GABAPENTIN 300 MG PO CAPS
ORAL_CAPSULE | ORAL | Status: AC
  Administered 2023-09-04: 300 mg via ORAL
  Filled 2023-09-04: qty 1

## 2023-09-04 MED ORDER — NALOXONE HCL 0.4 MG/ML IJ SOLN: 0.4000 mg | INTRAMUSCULAR | Status: DC | PRN

## 2023-09-04 MED ORDER — OXYCODONE HCL 5 MG PO TABS
5.0000 mg | ORAL_TABLET | ORAL | Status: AC | PRN
  Administered 2023-09-05 (×2): 5 mg via ORAL
  Filled 2023-09-04 (×2): qty 1

## 2023-09-04 MED ORDER — FENTANYL CITRATE (PF) 100 MCG/2ML IJ SOLN
25.0000 ug | INTRAMUSCULAR | Status: DC | PRN
  Administered 2023-09-04: 25 ug via INTRAVENOUS
  Filled 2023-09-04: qty 2

## 2023-09-04 MED ORDER — BUPIVACAINE HCL (PF) 0.25 % IJ SOLN
INTRAMUSCULAR | Status: AC
  Filled 2023-09-04: qty 60

## 2023-09-04 MED ORDER — KETOROLAC TROMETHAMINE 30 MG/ML IJ SOLN
30.0000 mg | Freq: Four times a day (QID) | INTRAMUSCULAR | Status: DC
  Administered 2023-09-04: 30 mg via INTRAVENOUS

## 2023-09-04 MED ORDER — MORPHINE SULFATE (PF) 2 MG/ML IV SOLN: 1.0000 mg | INTRAVENOUS | Status: DC | PRN

## 2023-09-04 MED ORDER — PRENATAL MULTIVITAMIN CH
1.0000 | ORAL_TABLET | Freq: Every day | ORAL | Status: DC
  Administered 2023-09-04 – 2023-09-06 (×3): 1 via ORAL
  Filled 2023-09-04 (×3): qty 1

## 2023-09-04 MED ORDER — ACETAMINOPHEN 500 MG PO TABS
ORAL_TABLET | ORAL | Status: AC
  Administered 2023-09-04: 1000 mg via ORAL
  Filled 2023-09-04: qty 2

## 2023-09-04 MED ORDER — LACTATED RINGERS IV SOLN: INTRAVENOUS | Status: DC | PRN

## 2023-09-04 MED ORDER — ACETAMINOPHEN 500 MG PO TABS
1000.0000 mg | ORAL_TABLET | Freq: Four times a day (QID) | ORAL | Status: DC
  Administered 2023-09-04 – 2023-09-06 (×9): 1000 mg via ORAL
  Filled 2023-09-04 (×9): qty 2

## 2023-09-04 MED ORDER — CEFAZOLIN SODIUM-DEXTROSE 2-4 GM/100ML-% IV SOLN
INTRAVENOUS | Status: AC
  Filled 2023-09-04: qty 100

## 2023-09-04 MED ORDER — DEXTROSE INFANT ORAL GEL 40%
ORAL | Status: AC
  Filled 2023-09-04: qty 1.2

## 2023-09-04 MED ORDER — SODIUM CHLORIDE 0.9 % IV SOLN
500.0000 mg | INTRAVENOUS | Status: AC
  Administered 2023-09-04: 500 mg via INTRAVENOUS
  Filled 2023-09-04: qty 5

## 2023-09-04 MED ORDER — ONDANSETRON HCL 4 MG/2ML IJ SOLN
INTRAMUSCULAR | Status: DC | PRN
  Administered 2023-09-04: 4 mg via INTRAVENOUS

## 2023-09-04 MED ORDER — SIMETHICONE 80 MG PO CHEW
80.0000 mg | CHEWABLE_TABLET | Freq: Three times a day (TID) | ORAL | Status: DC
  Administered 2023-09-04 – 2023-09-06 (×8): 80 mg via ORAL
  Filled 2023-09-04 (×8): qty 1

## 2023-09-04 MED ORDER — OXYCODONE HCL 5 MG/5ML PO SOLN: 5.0000 mg | Freq: Once | ORAL | Status: DC | PRN

## 2023-09-04 MED ORDER — COCONUT OIL OIL: 1.0000 | TOPICAL_OIL | Status: DC | PRN

## 2023-09-04 MED ORDER — BUPIVACAINE HCL (PF) 0.25 % IJ SOLN
INTRAMUSCULAR | Status: DC | PRN
  Administered 2023-09-04: 60 mL

## 2023-09-04 MED ORDER — LIDOCAINE 2% (20 MG/ML) 5 ML SYRINGE
INTRAMUSCULAR | Status: DC | PRN
  Administered 2023-09-04 (×2): 5 mL via INTRAVENOUS
  Administered 2023-09-04: 10 mL via INTRAVENOUS

## 2023-09-04 MED ORDER — OXYCODONE HCL 5 MG PO TABS
5.0000 mg | ORAL_TABLET | ORAL | Status: DC | PRN
  Administered 2023-09-05 – 2023-09-06 (×3): 5 mg via ORAL
  Filled 2023-09-04: qty 1
  Filled 2023-09-04: qty 2
  Filled 2023-09-04: qty 1

## 2023-09-04 MED ORDER — OXYCODONE HCL 5 MG PO TABS
5.0000 mg | ORAL_TABLET | Freq: Once | ORAL | Status: DC | PRN
  Filled 2023-09-04: qty 1

## 2023-09-04 MED ORDER — ONDANSETRON HCL 4 MG/2ML IJ SOLN
INTRAMUSCULAR | Status: AC
  Filled 2023-09-04: qty 2

## 2023-09-04 MED ORDER — MORPHINE SULFATE (PF) 0.5 MG/ML IJ SOLN
INTRAMUSCULAR | Status: AC
Start: 2023-09-04 — End: ?
  Filled 2023-09-04: qty 10

## 2023-09-04 MED ORDER — CEFAZOLIN SODIUM-DEXTROSE 2-4 GM/100ML-% IV SOLN
2.0000 g | INTRAVENOUS | Status: AC
  Administered 2023-09-04: 2 g via INTRAVENOUS

## 2023-09-04 MED ORDER — DEXTROSE INFANT ORAL GEL 40%: 0.5000 mL/kg | ORAL | Status: DC | PRN

## 2023-09-04 MED ORDER — WITCH HAZEL-GLYCERIN EX PADS: 1.0000 | MEDICATED_PAD | CUTANEOUS | Status: DC | PRN

## 2023-09-04 MED ORDER — ONDANSETRON HCL 4 MG/2ML IJ SOLN: 4.0000 mg | Freq: Three times a day (TID) | INTRAMUSCULAR | Status: DC | PRN

## 2023-09-04 MED ORDER — LACTATED RINGERS IV SOLN: INTRAVENOUS | Status: AC

## 2023-09-04 MED ORDER — 0.9 % SODIUM CHLORIDE (POUR BTL) OPTIME
TOPICAL | Status: DC | PRN
  Administered 2023-09-04: 900 mL

## 2023-09-04 MED ORDER — KETOROLAC TROMETHAMINE 30 MG/ML IJ SOLN
INTRAMUSCULAR | Status: AC
  Filled 2023-09-04: qty 1

## 2023-09-04 MED ORDER — DIPHENHYDRAMINE HCL 25 MG PO CAPS: 25.0000 mg | ORAL_CAPSULE | Freq: Four times a day (QID) | ORAL | Status: DC | PRN

## 2023-09-04 MED ORDER — GABAPENTIN 300 MG PO CAPS
300.0000 mg | ORAL_CAPSULE | Freq: Every day | ORAL | Status: DC
  Administered 2023-09-04 – 2023-09-05 (×2): 300 mg via ORAL
  Filled 2023-09-04 (×2): qty 1

## 2023-09-04 MED ORDER — ACETAMINOPHEN 325 MG PO TABS: 650.0000 mg | ORAL_TABLET | Freq: Four times a day (QID) | ORAL | Status: DC

## 2023-09-04 MED ORDER — DIBUCAINE (PERIANAL) 1 % EX OINT: 1.0000 | TOPICAL_OINTMENT | CUTANEOUS | Status: DC | PRN

## 2023-09-04 MED ORDER — FENTANYL CITRATE (PF) 100 MCG/2ML IJ SOLN
INTRAMUSCULAR | Status: AC
  Filled 2023-09-04: qty 2

## 2023-09-04 MED ORDER — DIPHENHYDRAMINE HCL 50 MG/ML IJ SOLN: 50.0000 mg | Freq: Once | INTRAMUSCULAR | Status: DC

## 2023-09-04 MED ORDER — ENOXAPARIN SODIUM 40 MG/0.4ML IJ SOSY
40.0000 mg | PREFILLED_SYRINGE | INTRAMUSCULAR | Status: DC
  Administered 2023-09-05 – 2023-09-06 (×2): 40 mg via SUBCUTANEOUS
  Filled 2023-09-04 (×3): qty 0.4

## 2023-09-04 MED ORDER — IBUPROFEN 600 MG PO TABS
600.0000 mg | ORAL_TABLET | Freq: Four times a day (QID) | ORAL | Status: DC
  Administered 2023-09-05 – 2023-09-06 (×4): 600 mg via ORAL
  Filled 2023-09-04 (×4): qty 1

## 2023-09-04 MED ORDER — OXYTOCIN-SODIUM CHLORIDE 30-0.9 UT/500ML-% IV SOLN: 2.5000 [IU]/h | INTRAVENOUS | Status: AC

## 2023-09-04 MED ORDER — GABAPENTIN 300 MG PO CAPS: 300.0000 mg | ORAL_CAPSULE | Freq: Once | ORAL | Status: AC

## 2023-09-04 SURGICAL SUPPLY — 26 items
CHLORAPREP W/TINT 26 (MISCELLANEOUS) ×1 IMPLANT
DRSG TELFA 3X8 NADH STRL (GAUZE/BANDAGES/DRESSINGS) ×1 IMPLANT
ELECT CAUTERY BLADE 6.4 (BLADE) ×1 IMPLANT
ELECTRODE REM PT RTRN 9FT ADLT (ELECTROSURGICAL) ×1 IMPLANT
GAUZE SPONGE 4X4 12PLY STRL (GAUZE/BANDAGES/DRESSINGS) ×1 IMPLANT
GLOVE SURG SYN 8.0 (GLOVE) ×1 IMPLANT
GLOVE SURG SYN 8.0 PF PI (GLOVE) ×1 IMPLANT
GOWN STRL REUS W/ TWL LRG LVL3 (GOWN DISPOSABLE) ×2 IMPLANT
GOWN STRL REUS W/ TWL XL LVL3 (GOWN DISPOSABLE) ×1 IMPLANT
MANIFOLD NEPTUNE II (INSTRUMENTS) ×1 IMPLANT
MAT PREVALON FULL STRYKER (MISCELLANEOUS) ×1 IMPLANT
NDL HYPO 22X1.5 SAFETY MO (MISCELLANEOUS) ×1 IMPLANT
NEEDLE HYPO 22X1.5 SAFETY MO (MISCELLANEOUS) ×1 IMPLANT
NS IRRIG 1000ML POUR BTL (IV SOLUTION) ×1 IMPLANT
PACK C SECTION AR (MISCELLANEOUS) ×1 IMPLANT
PAD OB MATERNITY 11 LF (PERSONAL CARE ITEMS) ×1 IMPLANT
PAD PREP OB/GYN DISP 24X41 (PERSONAL CARE ITEMS) ×1 IMPLANT
SCRUB CHG 4% DYNA-HEX 4OZ (MISCELLANEOUS) ×1 IMPLANT
STRAP SAFETY 5IN WIDE (MISCELLANEOUS) ×1 IMPLANT
SUT CHROMIC 1 CTX 36 (SUTURE) ×3 IMPLANT
SUT PLAIN GUT 0 (SUTURE) ×2 IMPLANT
SUT VIC AB 0 CT1 36 (SUTURE) ×2 IMPLANT
SYR 30ML LL (SYRINGE) ×2 IMPLANT
TAPE PAPER 3X10 WHT MICROPORE (GAUZE/BANDAGES/DRESSINGS) IMPLANT
TRAP FLUID SMOKE EVACUATOR (MISCELLANEOUS) ×1 IMPLANT
WATER STERILE IRR 500ML POUR (IV SOLUTION) ×1 IMPLANT

## 2023-09-04 NOTE — Discharge Summary (Signed)
 Obstetrical Discharge Summary  Patient Name: Summer Webb DOB: 02/12/2001 MRN: [id number]  Date of Admission: 09/03/2023 Date of Delivery: 09/04/23 Delivered by: Eustace Highland MD Date of Discharge: 09/06/23  Primary OB: [facility]  LMP:No LMP recorded. EDC Estimated Date of Delivery: 09/07/23 Gestational Age at Delivery: [redacted]w[redacted]d   Admitting Diagnosis:  Secondary Diagnosis: Patient Active Problem List   Diagnosis Date Noted   Encounter for induction of labor 09/03/2023   Encounter for supervision of normal pregnancy 02/22/2023   Encounter for long-term (current) use of medications 08/06/2019   Essential hypertension 05/12/2019   Other constipation 04/30/2019   ADD (attention deficit disorder) 10/02/2018   Cardiac murmur 08/23/2016   Fibrocystic breast changes 12/13/2012    Augmentation: AROM and Pitocin Complications:  Intrapartum complications/course:  active phase arrest - 9 cm  Date of Delivery: 09/06/2023  Delivered By: Eustace Highland MD Delivery Type: primary cesarean section, low transverse incision Anesthesia: epidural Placenta: manual Laceration:  Episiotomy: none Newborn Data: Female apgars 2/8   Postpartum Procedures: none  Edinburgh:     09/05/2023   11:20 PM  Edinburgh Postnatal Depression Scale Screening Tool  I have been able to laugh and see the funny side of things. 0  I have looked forward with enjoyment to things. 0  I have blamed myself unnecessarily when things went wrong. 1  I have been anxious or worried for no good reason. 1  I have felt scared or panicky for no good reason. 1  Things have been getting on top of me. 0  I have been so unhappy that I have had difficulty sleeping. 0  I have felt sad or miserable. 0  I have been so unhappy that I have been crying. 0  The thought of harming myself has occurred to me. 0  Edinburgh Postnatal Depression Scale Total 3   (Cesarean Section):  Patient had an uncomplicated postpartum course.   By time of discharge on POD#2, her pain was controlled on oral pain medications; she had appropriate lochia and was ambulating, voiding without difficulty, tolerating regular diet and passing flatus.   She was deemed stable for discharge to home.    Discharge Physical Exam:  BP (!) 107/55 (BP Location: Left Arm)   Pulse 60   Temp 97.9 F (36.6 C) (Oral)   Resp 18   Ht 5\' 6"  (1.676 m)   Wt 93.9 kg   SpO2 98%   Breastfeeding Unknown   BMI 33.41 kg/m   General: NAD CV: RRR Pulm: CTABL, nl effort ABD: s/nd/nt, fundus firm and below the umbilicus Lochia: moderate Incision: c/d/i DVT Evaluation: LE non-ttp, no evidence of DVT on exam.  Hemoglobin  Date Value Ref Range Status  09/05/2023 9.1 (L) 12.0 - 15.0 g/dL Final  95/62/1308 65.7 11.1 - 15.9 g/dL Final   HCT  Date Value Ref Range Status  09/05/2023 26.9 (L) 36.0 - 46.0 % Final   Hematocrit  Date Value Ref Range Status  04/01/2021 37.6 34.0 - 46.6 % Final     Disposition: stable, discharge to home. Baby Feeding: breastmilk Baby Disposition: home with mom  Rh Immune globulin given:  Rubella vaccine given:  Tdap vaccine given in AP or PP setting:  Flu vaccine given in AP or PP setting:   Contraception: Liletta IUD  Risk assessment for postpartum VTE and prophylactic treatment: Very high risk factors: None High risk factors: Unscheduled cesarean after labor  Moderate risk factors: Cesarean delivery  and BMI 30-40 kg/m2  Postpartum  VTE prophylaxis with LMWH ordered  Prenatal Labs:   Hepatitis B Surface Antigen - [facility] -- Negative  HCV Ab - [facility] -- Non Reactive  RPR - [facility] Non Reactive Non Reactive  Rubella Antibodies, IgG - [facility] -- 1.08   ABO Grouping - [facility] -- A  Rh Factor - [facility] -- Positive   Antibody Screen - [facility] -- Negative  Varicella Zoster IgG - [facility] -- Reactive   Gbs = positive   Plan:  Summer Webb was discharged to home in good condition. Follow-up appointment with  delivering provider in 6 weeks.  Discharge Medications: Allergies as of 09/06/2023   No Known Allergies      Medication List     STOP taking these medications    guaiFENesin  600 MG 12 hr tablet Commonly known as: Mucinex    montelukast  10 MG tablet Commonly known as: SINGULAIR        TAKE these medications    acetaminophen  500 MG tablet Commonly known as: TYLENOL  Take 2 tablets (1,000 mg total) by mouth every 6 (six) hours.   albuterol  108 (90 Base) MCG/ACT inhaler Commonly known as: VENTOLIN  HFA Inhale 2 puffs into the lungs every 4 (four) hours as needed for wheezing or shortness of breath.   coconut oil Oil Apply 1 Application topically as needed.   dibucaine 1 % Oint Commonly known as: NUPERCAINAL Place 1 Application rectally as needed for hemorrhoids.   enoxaparin 40 MG/0.4ML injection Commonly known as: LOVENOX Inject 0.4 mLs (40 mg total) into the skin daily for 21 days. Start taking on: Sep 07, 2023   escitalopram 10 MG tablet Commonly known as: LEXAPRO Take 1 tablet (10 mg total) by mouth at bedtime.   famotidine-calcium carbonate-magnesium hydroxide 10-800-165 MG chewable tablet Commonly known as: PEPCID COMPLETE Chew 1 tablet by mouth daily as needed.   ferrous sulfate 325 (65 FE) MG tablet Take 1 tablet (325 mg total) by mouth daily. Start taking on: Sep 07, 2023   fexofenadine  180 MG tablet Commonly known as: ALLEGRA  Take 1 tablet (180 mg total) by mouth daily.   fluticasone  50 MCG/ACT nasal spray Commonly known as: FLONASE  Place 2 sprays into both nostrils daily.   ibuprofen  600 MG tablet Commonly known as: ADVIL  Take 1 tablet (600 mg total) by mouth every 6 (six) hours. What changed:  medication strength how much to take when to take this reasons to take this   Linzess  72 MCG capsule Generic drug: linaclotide  TAKE 1 CAPSULE(72 MCG) BY MOUTH DAILY BEFORE BREAKFAST   oxyCODONE 5 MG immediate release tablet Commonly known as: Oxy  IR/ROXICODONE Take 1-2 tablets (5-10 mg total) by mouth every 4 (four) hours as needed for moderate pain (pain score 4-6).   prenatal multivitamin Tabs tablet Take 1 tablet by mouth daily at 12 noon.   senna-docusate 8.6-50 MG tablet Commonly known as: Senokot-S Take 2 tablets by mouth daily. Start taking on: Sep 07, 2023 What changed: how much to take   simethicone 80 MG chewable tablet Commonly known as: MYLICON Chew 1 tablet (80 mg total) by mouth as needed for flatulence.   witch hazel-glycerin pad Commonly known as: TUCKS Apply 1 Application topically as needed for hemorrhoids.         Follow-up Information     Schermerhorn, Joselyn Nicely, MD Follow up in 2 week(s).   Specialty: Obstetrics and Gynecology Why: post op check/wound check Contact information: [address] [place] [address] [phone]  Signed:  Arzella Laurence CNM

## 2023-09-04 NOTE — Lactation Note (Signed)
 This note was copied from a baby's chart. Lactation Consultation Note  Patient Name: Summer Webb [id number] Date: 09/04/2023 Age:25 hours Reason for consult: Initial assessment;Primapara   Maternal Data Has patient been taught Hand Expression?: Yes Does the patient have breastfeeding experience prior to this delivery?: No  Initial assessment w/ a P1 patient and 7hr old baby Summer "Summer Webb".  This was a c-section due to failure to progress. Patient w/ a hx of anemia, anxiety.  Patient verbalized to Summer Webb that her goal is breastfeeding and she stated she was ok providing formula for her infant.  MOB stated that she just recently provided 15ml of formula because her body isnt making milk yet.  Infant is currently getting his blood sugar checked. During this visit, infant sugar was 35.   Feeding Mother's Current Feeding Choice: Breast Milk and Formula  LC assisted with a feeding at the breast w/ patient and family.  Patient has short nipples and is using a 20mm nipple shield.  Patient attempted to latch infant without shield but infant didn't latch.  Infant eventually latched to breast in football hold w/ the nipple shield.   LATCH Score Latch: Repeated attempts needed to sustain latch, nipple held in mouth throughout feeding, stimulation needed to elicit sucking reflex.  Audible Swallowing: None  Type of Nipple: Everted at rest and after stimulation  Comfort (Breast/Nipple): Soft / non-tender  Hold (Positioning): Assistance needed to correctly position infant at breast and maintain latch.  LATCH Score: 6   Lactation Tools Discussed/Used Tools: Pump Breast pump type: Double-Electric Breast Pump Pump Education: Setup, frequency, and cleaning;Milk Storage Reason for Pumping: Patient is using a NS; Pumping should be done every other feeding. Pumping frequency: Pump every other feeding OR if receives a bottle of formula if breast are not stimulated. Pumped volume: 1 mL  LC set up a DEBP  for patient and started her pumping because she is using a nipple shield.  Nipple shield education and pumping was provided to patient.  The goal will be for patient to pump every other feeding.  Mom was able to pump out 1ml of EBM.  Interventions Interventions: Breast feeding basics reviewed;Assisted with latch;Hand express;Adjust position;Support pillows;Position options;Expressed milk;DEBP;Education  LC provided education on the following;  milk production expectations, hunger cues, day 1/2 wet/dirty diapers, hand expression, benefits of STS and arousing infant for a feeding.  Lactation informed patient of feeding infant at least 8 or more times w/in a 24hr period but not exceeding 3hrs. Patient verbalized understanding.   Discharge Pump: Personal ([place]) WIC Program: No  Consult Status Consult Status: Follow-up Follow-up type: In-patient    Summer Webb S Summer Webb 09/04/2023, 4:00 PM

## 2023-09-04 NOTE — H&P (Signed)
 OB History & Physical   History of Present Illness:  Chief Complaint:   HPI:  Summer Webb is a 23 y.o. G1P0 female at [redacted]w[redacted]d dated by :MP.  She presents to L&D for an elective IOL.  She reports:  -active fetal movement -no leakage of fluid  -no vaginal bleeding -no contractions  Pregnancy Issues: 1. Anemia 2. GBS positive 3. Anxiety    Maternal Medical History:   Past Medical History:  Diagnosis Date   ADD (attention deficit disorder)     Past Surgical History:  Procedure Laterality Date   WISDOM TOOTH EXTRACTION      No Known Allergies  Prior to Admission medications   Medication Sig Start Date End Date Taking? Authorizing Provider  albuterol  (VENTOLIN  HFA) 108 (90 Base) MCG/ACT inhaler Inhale 2 puffs into the lungs every 4 (four) hours as needed for wheezing or shortness of breath. 04/17/23   Trenda Frisk, FNP  famotidine-calcium carbonate-magnesium hydroxide (PEPCID COMPLETE) 10-800-165 MG chewable tablet Chew 1 tablet by mouth daily as needed.    [provider]  fexofenadine  (ALLEGRA ) 180 MG tablet Take 1 tablet (180 mg total) by mouth daily. 04/01/21   Abernathy, Alyssa, NP  fluticasone  (FLONASE ) 50 MCG/ACT nasal spray Place 2 sprays into both nostrils daily. 03/17/20   Wayna Hails, NP  guaiFENesin  (MUCINEX ) 600 MG 12 hr tablet Take 1 tablet (600 mg total) by mouth 2 (two) times daily. Patient not taking: Reported on 08/24/2023 04/01/21   Abernathy, Alyssa, NP  ibuprofen  (ADVIL ) 800 MG tablet Take 1 tablet (800 mg total) by mouth every 8 (eight) hours as needed (pain). Patient not taking: Reported on 08/24/2023 09/28/21   Ann Keto, MD  LINZESS  72 MCG capsule TAKE 1 CAPSULE(72 MCG) BY MOUTH DAILY BEFORE BREAKFAST 03/09/23   Trenda Frisk, FNP  montelukast  (SINGULAIR ) 10 MG tablet Take 1 tablet (10 mg total) by mouth at bedtime. Patient not taking: Reported on 08/24/2023 02/18/21   Khan, Fozia M, MD  Prenatal Vit-Fe Fumarate-FA (PRENATAL  MULTIVITAMIN) TABS tablet Take 1 tablet by mouth daily at 12 noon.    [provider]  senna-docusate (SENOKOT-S) 8.6-50 MG tablet Take 1 tablet by mouth daily.    [provider]     Prenatal care site: [facility]    Social History: She  reports that she has never smoked. She has never used smokeless tobacco. She reports current alcohol use. She reports that she does not use drugs.  Family History: family history includes Diabetes in her father; Hypertension in her mother.   Review of Systems: A full review of systems was performed and negative except as noted in the HPI.    Physical Exam:  Vital Signs: BP 118/63   Pulse 87   Temp 99 F (37.2 C) (Oral)   Resp 15   Ht 5\' 6"  (1.676 m)   Wt 93.9 kg   SpO2 99%   BMI 33.41 kg/m   General:   alert and cooperative  Skin:  normal  Neurologic:    Alert & oriented x 3  Lungs:   Nl effort  Heart:   regular rate and rhythm  Abdomen:  soft, non-tender; bowel sounds normal; no masses,  no organomegaly  Extremities: : non-tender, symmetric, no edema bilaterally.       Pertinent Results:  Prenatal Labs: Blood type/Rh A pos  Antibody screen neg  Rubella Immune  Varicella Immune  RPR NR  HBsAg Neg  HIV NR  GC neg  Chlamydia neg  Genetic screening declined  1 hour GTT 83  3 hour GTT   GBS pos   FHT: 135 Category/reactivity:  category 1 TOCO: quiet SVE: Dilation: 9 / Effacement (%): 80 / Station: Plus 1      Assessment:  Summer Webb is a 23 y.o. G1P0 female at [redacted]w[redacted]d here for IOL.   Plan:  1. Admit to Labor & Delivery; consents reviewed and obtained  2. Fetal Well being  - Fetal Tracing:  - GBS pos - Presentation:  confirmed by    3. Routine OB: - Prenatal labs reviewed, as above - Rh pos - CBC & T&S on admit - Clear fluids, IVF  4. Monitoring of Labor -  Contractions by external toco in place -  Plan for induction with cytotec -  Plan for continuous fetal monitoring  -   Maternal pain control as desired: IVPM, nitrous, regional anesthesia - Anticipate vaginal delivery  5. Post Partum Planning: - Infant feeding: Breastfeeding - Contraception: Liletta IUD - Tdap: given 06/29/23  - Flu: declined 02/2023 - RSV: was out of season  Arzella Laurence CNM 09/03/23 0900

## 2023-09-04 NOTE — Progress Notes (Signed)
 Patient ID: Summer Webb, female   DOB: [birth year], 23 y.o.   MRN: [id number] Apa  no progression past 9 cm for 4 hrs . I have counseled pt fpr a c/s  The risks of cesarean section discussed with the patient included but were not limited to: bleeding which may require transfusion or reoperation; infection which may require antibiotics; injury to bowel, bladder, ureters or other surrounding organs; injury to the fetus; need for additional procedures including hysterectomy in the event of a life-threatening hemorrhage; placental abnormalities wth subsequent pregnancies, incisional problems, thromboembolic phenomenon and other postoperative/anesthesia complications. The patient concurred with the proposed plan, giving informed written consent for the procedure.    Anesthesia and OR aware. Preoperative prophylactic antibiotics and SCDs ordered on call to the OR.  To OR when ready.

## 2023-09-04 NOTE — Brief Op Note (Signed)
 09/04/2023  7:13 AM  PATIENT:  Summer Webb  23 y.o. female  PRE-OPERATIVE DIAGNOSIS: active phase arrest   POST-OPERATIVE DIAGNOSIS:  active phase arrest   PROCEDURE:  Procedure(s): CESAREAN DELIVERY (N/A)primary LTCS  SURGEON:  Surgeons and Role:    * Jacqulyn Barresi, Joselyn Nicely, MD - Primary  PHYSICIAN ASSISTANT: Arzella Laurence  ASSISTANTS: cst   ANESTHESIA:   epidural  EBL: 725 cc iof 600 cc BLOOD ADMINISTERED:none  DRAINS: Urinary Catheter (Foley)   LOCAL MEDICATIONS USED:  MARCAINE     SPECIMEN:  No Specimen  DISPOSITION OF SPECIMEN:  N/A  COUNTS:  YES  TOURNIQUET:  * No tourniquets in log *  DICTATION: .Other Dictation: Dictation Number verbal  PLAN OF CARE: Admit to inpatient   PATIENT DISPOSITION:  PACU - hemodynamically stable.   Delay start of Pharmacological VTE agent (>24hrs) due to surgical blood loss or risk of bleeding: not applicable

## 2023-09-04 NOTE — Progress Notes (Signed)
 L&D Note    Subjective:  Feeling intermittent rectal pressure with contractions. Stating she is tired  Objective:      09/04/2023    3:30 AM 09/04/2023    3:15 AM 09/04/2023    3:00 AM  Vitals with BMI  Systolic 118 109 409  Diastolic 63 49 60  Pulse 87 76 84     Gen: alert, fatigued, cooperative, no distress FHR: Baseline: 145 bpm, Variability: moderate, Accels: Abscent, Decels: variable periods of recurrent and intermittent Toco: regular, every 4-5 minutes SVE: Dilation: 9 Effacement (%): 80 Cervical Position: Middle Station: Plus 1 Presentation: Vertex Exam by:: Marcille Severance, CNM  Medications SCHEDULED MEDICATIONS   diphenhydrAMINE  50 mg Intravenous Once   misoprostol       oxytocin       oxytocin 40 units in LR 1000 mL  333 mL Intravenous Once   sodium citrate-citric acid  30 mL Oral 30 min Pre-Op    MEDICATION INFUSIONS   azithromycin       ceFAZolin (ANCEF) IV     fentaNYL 2 mcg/mL w/bupivacaine 0.125% in NS 250 mL     oxytocin     oxytocin Stopped (09/04/23 0524)   pencillin G potassium IV 3 Million Units (09/04/23 0420)    PRN MEDICATIONS  acetaminophen , diphenhydrAMINE, ePHEDrine, ePHEDrine, fentaNYL (SUBLIMAZE) injection, fentaNYL 2 mcg/mL w/bupivacaine 0.125% in NS 250 mL, lidocaine (PF), misoprostol, ondansetron, oxyCODONE-acetaminophen , oxyCODONE-acetaminophen , oxytocin, phenylephrine, phenylephrine, sodium citrate-citric acid, terbutaline   Assessment & Plan:  23 y.o. G1P0 at [redacted]w[redacted]d admitted for Labor_induction_indication: Elective at term -GBS: positive -IP Antibiotics: abx: PCN x4 -Membranes ruptured, clear fluid -Recheck:Evaluated by digital exam. -Preeclampsia:  labs stable -Pain: none -Intervention: reduce stimulation (IV Pitocin), D/C stimulation, place IUPC, and FSE, plan for primary C-Section for arrest of first stage labor -Analgesia: regional anesthesia Discussed with the patient, spouse, and family at the bedside regarding fetal heart tones,  the arrest of the first stage of labor, and the necessity for a primary cesarean section. The patient consented to the plan, expressing her fatigue and a desire to prioritize the safety of the baby. Notified Dr Elva Hamburger. Schermerhorn of labor events and need for primary c-section for a failed induction, he agreed and is en route to hospital    Blessed Girdner, CNM  09/04/2023 5:44 AM  [facility]

## 2023-09-04 NOTE — Transfer of Care (Signed)
 Immediate Anesthesia Transfer of Care Note  Patient: Summer Webb  Procedure(s) Performed: CESAREAN DELIVERY  Patient Location: PACU and L&D  Anesthesia Type:Epidural  Level of Consciousness: awake, alert , and oriented  Airway & Oxygen Therapy: Patient Spontanous Breathing  Post-op Assessment: Report given to RN and Post -op Vital signs reviewed and stable  Post vital signs: Reviewed and stable  Last Vitals:  Vitals Value Taken Time  BP 118/83 (94) 09/04/2023 0725  Temp    Pulse 87   Resp 18   SpO2 94     Last Pain:  Vitals:   09/04/23 0526  TempSrc: Oral  PainSc:          Complications: No notable events documented.

## 2023-09-05 ENCOUNTER — Encounter: Payer: Self-pay | Admitting: Obstetrics and Gynecology

## 2023-09-05 LAB — CBC
HCT: 26.9 % — ABNORMAL LOW (ref 36.0–46.0)
Hemoglobin: 9.1 g/dL — ABNORMAL LOW (ref 12.0–15.0)
MCH: 31.8 pg (ref 26.0–34.0)
MCHC: 33.8 g/dL (ref 30.0–36.0)
MCV: 94.1 fL (ref 80.0–100.0)
Platelets: 170 10*3/uL (ref 150–400)
RBC: 2.86 MIL/uL — ABNORMAL LOW (ref 3.87–5.11)
RDW: 13.4 % (ref 11.5–15.5)
WBC: 14.6 10*3/uL — ABNORMAL HIGH (ref 4.0–10.5)
nRBC: 0 % (ref 0.0–0.2)

## 2023-09-05 MED ORDER — ESCITALOPRAM OXALATE 10 MG PO TABS
10.0000 mg | ORAL_TABLET | Freq: Every day | ORAL | Status: DC
  Filled 2023-09-05: qty 1

## 2023-09-05 MED ORDER — FERROUS SULFATE 325 (65 FE) MG PO TABS
325.0000 mg | ORAL_TABLET | Freq: Every day | ORAL | Status: DC
  Administered 2023-09-05 – 2023-09-06 (×2): 325 mg via ORAL
  Filled 2023-09-05 (×2): qty 1

## 2023-09-05 NOTE — Lactation Note (Signed)
 This note was copied from a baby's chart. Lactation Consultation Note  Patient Name: Boy Amandah Koors [id number]'[id number] Date: 09/05/2023 Age:25 hours Reason for consult: Follow-up assessment;Primapara;Term   Maternal Data Follow up assessment w/a 27hr old baby boy and parents.  Mom stated that she was exhausted but infant has not been going to the breast.  MOB stated that infant does not like her breast and is getting frustrated that she has no milk.    She stated that they are giving infant bottle first and following up with breast.  Feeding Mother's Current Feeding Choice: Breast Milk and Formula  Parents would like lactation to return to observe a feeding at the breast.  Interventions Interventions: Education  LC informed family the reason infant isnt going to breast is because she is offering bottle first.  It is best practice to offer infant breast first, and then after feeding on both breast if infant is still hungry she may supplement w/ formula.  Parents verbalized understanding.   Consult Status Consult Status: Follow-up Follow-up type: In-patient    Imo Cumbie S Billijo Dilling 09/05/2023, 1:22 PM

## 2023-09-05 NOTE — Progress Notes (Signed)
 PostOp/Postpartum Day #1  Subjective: 23 y.o. G1P1001 postpartum/PostOp day #1 status post primary cesarean section. She is ambulating, is tolerating po, is voiding spontaneously.  Her pain is well controlled on PO pain medications. Her lochia is less than menses.  Objective: BP (!) 105/48 (BP Location: Left Arm)   Pulse 80   Temp 98.2 F (36.8 C) (Oral)   Resp 20   Ht 5\' 6"  (1.676 m)   Wt 93.9 kg   SpO2 99%   Breastfeeding Unknown   BMI 33.41 kg/m    Physical Exam:  General: alert, cooperative, appears stated age, and no distress Breasts: soft/nontender Pulm: nl effort Abdomen: soft, non-tender, active bowel sounds Uterine Fundus: firm Incision: no significant drainage on pressure dressing; pressure dressing clean, dry, and intact  Perineum: minimal edema, intact Lochia: appropriate DVT Evaluation: No evidence of DVT seen on physical exam.  Recent Labs    09/04/23 1108 09/05/23 0214  HGB 10.8* 9.1*  HCT 32.7* 26.9*  WBC 17.7* 14.6*  PLT 212 170    Assessment/Plan: 22 y.o. G1P1001 postpartum/PostOp day # 1  1. Continue routine postpartum care  2. Infant feeding status: breast feeding -Lactation consult PRN for breastfeeding   3. Contraception plan: IUD--> Liletta   4. Acute blood loss anemia - clinically significant.  -Hemodynamically stable and asymptomatic -Intervention: start on oral supplementation with ferrous sulfate 325 mg   5. Immunization status:   all immunizations up to date  Disposition: plan for discharge home tomorrow    LOS: 2 days   Dajiah Kooi, CNM 09/05/2023, 10:24 AM   ----- Roanne Haye Certified Nurse Midwife [facility]

## 2023-09-05 NOTE — Anesthesia Postprocedure Evaluation (Signed)
 Anesthesia Post Note  Patient: Summer Webb  Procedure(s) Performed: CESAREAN DELIVERY  Patient location during evaluation: Mother Baby Anesthesia Type: Epidural Level of consciousness: awake and alert Pain management: pain level controlled Vital Signs Assessment: post-procedure vital signs reviewed and stable Respiratory status: spontaneous breathing, nonlabored ventilation and respiratory function stable Cardiovascular status: stable Postop Assessment: no headache, no backache and epidural receding Anesthetic complications: no   No notable events documented.   Last Vitals:  Vitals:   09/04/23 2306 09/05/23 0826  BP: (!) 96/59 (!) 105/48  Pulse: 79 80  Resp: 18 20  Temp: 36.8 C   SpO2: 98% 99%    Last Pain:  Vitals:   09/05/23 0627  TempSrc:   PainSc: 5                  Josefine Nice

## 2023-09-05 NOTE — Anesthesia Post-op Follow-up Note (Signed)
  Anesthesia Pain Follow-up Note  Patient: Summer Webb  Day #: 1  Date of Follow-up: 09/05/2023 Time: 8:27 AM  Last Vitals:  Vitals:   09/04/23 2306 09/05/23 0826  BP: (!) 96/59 (!) 105/48  Pulse: 79 80  Resp: 18 20  Temp: 36.8 C   SpO2: 98% 99%    Level of Consciousness: alert  Pain: mild   Side Effects:None  Catheter Site Exam:clean, dry  Anti-Coag Meds (From admission, onward)    Start     Dose/Rate Route Frequency Ordered Stop   09/05/23 0800  enoxaparin (LOVENOX) injection 40 mg        40 mg Subcutaneous Every 24 hours 09/04/23 0935          Plan: D/C from anesthesia care at surgeon's request  Josefine Nice

## 2023-09-05 NOTE — Op Note (Signed)
 NAMEDUSKA, BRACKENRIDGE MEDICAL RECORD NO: [id number] ACCOUNT NO: [id number] DATE OF BIRTH: 01-Jan-2001 FACILITY: [facility] LOCATION: [facility] PHYSICIAN: Carolynn Citrin, MD  Operative Report   DATE OF PROCEDURE: 09/04/2023  PREOPERATIVE DIAGNOSES: 1. 38+3 weeks estimated gestational age. 2. Active phase of arrest.  Dictation ends abruptly    SUJ D: 09/04/2023 12:21:46 pm T: 09/05/2023 2:45:00 am  JOB: [id number]/ [id number]

## 2023-09-05 NOTE — Op Note (Signed)
 NAMESHAWNTRICE, BUDMAN MEDICAL RECORD NO: [id number] ACCOUNT NO: [id number] DATE OF BIRTH: 09/04/2000 FACILITY: [facility] LOCATION: [facility] PHYSICIAN: Carolynn Citrin, MD  Operative Report   DATE OF PROCEDURE: 09/04/2023  PREOPERATIVE DIAGNOSES: 1. 39+2 weeks estimated gestational age. 2. Active phase of arrest.  POSTOPERATIVE DIAGNOSES: 1. 39+2 weeks estimated gestational age. 2. Active phase of arrest. 3. Female delivered.  PROCEDURE: Primary low transverse cesarean section.  SURGEON: Carolynn Citrin, MD  FIRST ASSISTANT: Arzella Laurence, certified nurse midwife.  ANESTHESIA: Surgical dosing of continuous lumbar epidural.  INDICATIONS: A 23 year old gravida 1, para 0 patient brought in the day prior to the procedure for an elective induction of labor. The patient did not progress past 9 cm despite 4 hours of monitoring, intravenous Pitocin.  DESCRIPTION OF PROCEDURE: After adequate continuous lumbar epidural surgical dosing, the patient was placed in a dorsal supine position with hip roll on the right side. The patient's abdomen was prepped and draped in normal sterile fashion. Vaginal prep  was performed as well. The patient did receive 2 g of IV Ancef and 500 mg of azithromycin  for surgical prophylaxis. Timeout was performed. A Pfannenstiel incision was made two fingerbreadths above the symphysis pubis. Sharp dissection was used to  identify the fascia. The fascia was opened in a transverse fashion. The superior aspect of the fascia was grasped with Kocher clamps and the recti muscles were dissected free. Inferior aspect of the fascia was grasped with Kocher clamps and the  pyramidalis muscle was dissected free. Entry into the peritoneal cavity was accomplished sharply. The vesicouterine peritoneal fold was identified and opened and a bladder flap was created. The bladder was reflected inferiorly. A low transverse uterine  incision was made. Upon entering into the  endometrial cavity, clear fluid resulted. The uterine incision was extended with blunt transverse traction. The wedged head with caput was brought up to the incision and with fundal pressure the head was  delivered and a nuchal cord was reduced. Shoulders and body were delivered without difficulty. The female was then initially dried on the abdomen and no spontaneous cry, although tone was good and color appeared normal. Nursery staff desired early cord  clamping, which was performed approximately 20 seconds into our drying process. Female was then passed to nursery staff. Ultimately assigned Apgar scores of 2 and 8. The placenta was manually delivered and the uterus was exteriorized. The endometrial  cavity was wiped, cleaned with laparotomy tape. The uterine incision was then closed with 1 chromic suture in a running locking fashion. Good approximation of edges. Two additional sutures were used for hemostasis. Fallopian tubes and ovaries appeared  normal. Posterior cul-de-sac was irrigated and suctioned and the uterus was placed back into the abdominal cavity. Paracolic gutters were then wiped clean with laparotomy tape and again the uterine incision appeared hemostatic. Fascia was then closed  with 0 Vicryl suture in a running non-locking fashion. Two separate sutures were used. Good hemostasis was noted. Subcutaneous tissues were irrigated and Bovie for hemostasis. Given the depth of the subcutaneous tissues of 4 cm, the dead space was closed  with a running 2-0 chromic suture. The skin was reapproximated with Insorb absorbable staples. Good cosmetic effect. 30 mL of 0.25% Marcaine plus normal saline solution was injected beneath the skin for anesthetic purposes. The patient did receive 30 mg  of intravenous Toradol at the end of the procedure. There were no complications. The patient tolerated the procedure well.  INTRAOPERATIVE FLUIDS: 600 mL.  QUANTITATIVE BLOOD  LOSS: 725 mL.  URINE OUTPUT: 125  mL.  DISPOSITION: The patient was taken to the recovery room in good condition.    SUJ D: 09/04/2023 12:28:21 pm T: 09/05/2023 3:04:00 am  JOB: [id number]/ [id number]

## 2023-09-05 NOTE — Lactation Note (Signed)
 This note was copied from a baby's chart. Lactation Consultation Note  Patient Name: Summer Webb [id number] Date: 09/05/2023 Age:25 hours Reason for consult: Follow-up assessment;Mother's request;Primapara;Term;Breastfeeding assistance   Maternal Data LC to room to assist patient with a feeding at the breast.  Feeding Mother's Current Feeding Choice: Breast Milk and Formula Nipple Type: Slow - flow  Infant was aroused and placed to patient rt breast in football hold.  Patient has good positioning at the breast.  Latching was attempted without the nipple shield first.  Infant had a hard time maintaining latch.  Nipple was placed on, infant latched and nursed off and on.  LC enticed infant by putting formula into the nipple shield which encouraged infant to suckle.    After nursing 15 minutes, patient fed infant 20 ml of 24kcal formula.  LATCH Score Latch: Grasps breast easily, tongue down, lips flanged, rhythmical sucking.  Audible Swallowing: None  Type of Nipple: Everted at rest and after stimulation (Short)  Comfort (Breast/Nipple): Soft / non-tender  Hold (Positioning): Assistance needed to correctly position infant at breast and maintain latch.  LATCH Score: 7  Interventions Interventions: Assisted with latch;Breast compression;Adjust position;Support pillows;Position options;Education  LC reminded mom to always offer infant breast first.  And that if she uses the nipple shield she will need to make sure she is pumping every other feeding session. Parents verbalized understanding.   Consult Status Consult Status: Follow-up Follow-up type: In-patient    [name] S Lynton Crescenzo 09/05/2023, 2:27 PM

## 2023-09-06 MED ORDER — COCONUT OIL OIL: 1.0000 | TOPICAL_OIL | Status: DC | PRN

## 2023-09-06 MED ORDER — ENOXAPARIN SODIUM 40 MG/0.4ML IJ SOSY: 40.0000 mg | PREFILLED_SYRINGE | INTRAMUSCULAR | 0 refills | Status: AC

## 2023-09-06 MED ORDER — IBUPROFEN 600 MG PO TABS
600.0000 mg | ORAL_TABLET | Freq: Four times a day (QID) | ORAL | 0 refills | Status: AC
Start: 2023-09-06 — End: ?

## 2023-09-06 MED ORDER — OXYCODONE HCL 5 MG PO TABS
5.0000 mg | ORAL_TABLET | ORAL | 0 refills | Status: DC | PRN
Start: 2023-09-06 — End: 2023-11-16

## 2023-09-06 MED ORDER — ESCITALOPRAM OXALATE 10 MG PO TABS: 10.0000 mg | ORAL_TABLET | Freq: Every day | ORAL | 0 refills | Status: AC

## 2023-09-06 MED ORDER — FERROUS SULFATE 325 (65 FE) MG PO TABS: 325.0000 mg | ORAL_TABLET | Freq: Every day | ORAL | Status: DC

## 2023-09-06 MED ORDER — WITCH HAZEL-GLYCERIN EX PADS: 1.0000 | MEDICATED_PAD | CUTANEOUS | Status: AC | PRN

## 2023-09-06 MED ORDER — ACETAMINOPHEN 500 MG PO TABS: 1000.0000 mg | ORAL_TABLET | Freq: Four times a day (QID) | ORAL | Status: AC

## 2023-09-06 MED ORDER — SIMETHICONE 80 MG PO CHEW: 80.0000 mg | CHEWABLE_TABLET | ORAL | Status: DC | PRN

## 2023-09-06 MED ORDER — DIBUCAINE (PERIANAL) 1 % EX OINT: 1.0000 | TOPICAL_OINTMENT | CUTANEOUS | Status: AC | PRN

## 2023-09-06 MED ORDER — SENNOSIDES-DOCUSATE SODIUM 8.6-50 MG PO TABS: 2.0000 | ORAL_TABLET | Freq: Every day | ORAL | Status: DC

## 2023-09-06 NOTE — Lactation Note (Signed)
 This note was copied from a baby's chart. Lactation Consultation Note  Patient Name: Summer Webb [id number] Date: 09/06/2023 Age:25 hours Reason for consult: Follow-up assessment;Primapara;Term;Other (Comment) (Discharge Education)   Maternal Data Follow up assessment and discharge education w/ 49hr old baby Summer and family.  Patient stated that she believes she is getting the hang or getting into a pattern with feeding.  She didn't have any questions or concerns at the time.   Feeding Mother's Current Feeding Choice: Breast Milk and Formula  Interventions Interventions: Education;CDC milk storage guidelines  Discharge Discharge Education: Engorgement and breast care;Warning signs for feeding baby;Outpatient recommendation  Education on engorgement prevention/treatment was discussed as well as breastmilk storage guidelines.  LC provided patient with a handout on breastmilk storage guidelines from [facility]. [facility] outpatient lactation services phone number written on the white board in the room.  Patient verbalized understanding.  LC also provided education from the postpartum book about warning signs to look for in a poor feeding.    Consult Status Consult Status: Complete Follow-up type: Call as needed    Summer Webb 09/06/2023, 10:11 AM

## 2023-09-06 NOTE — Discharge Instructions (Signed)
Discharge instructions Bleeding: Your bleeding could continue up to 6 weeks, the flow should gradually decrease and the color should become dark then lightened over the next couple of weeks. If you notice you are bleeding heavily or passing clots larger than the size of your fist, PLEASE call your physician. No TAMPONS, DOUCHING, ENEMAS OR SEXUAL INTERCOURSE for 6 weeks. Incision: The honeycomb dressing can be removed in 5-7 days. Remove earlier if any water gets underneath it or if there is a lot of drainage. After the dressing is off, you can let the warm soapy water from the shower run over the incision and pat dry with a clean towel. Watch the incision for signs of infection, such as, redness, warmth, oozing pus. AfterPains: This is the uterus contracting back to its normal position and size. Use medications prescribed or recommended by your physician to help relieve this discomfort. Bowels/Hemorrhoids: Drink plenty of water and stay active. Increase fiber, fresh fruits and vegetables in your diet. Rest/Activity: Rest when the baby is resting;  Do not lift > 10 lbs for 6 weeks. No driving for 1-2 weeks. Bathing: Shower daily! Diet: Continue daily prenatal vitamin and iron until your follow up visit to help replenish nutrients and vitamins. If breastfeeding eat extra calories and increase your fluid intake to 12 glasses a day. Contraception: Consult with your provider on what method of birth control you would like to use. Breastfeeding: You may have a slight fever when your milk comes in, but it should go away on its own. If it does not, and rises above 101.0 please call the doctor. Bottlefeeding: wear a snug fitting bra without underwires continuously for 3-5days, avoid any nipple/breast stimulation. If engorgement occurs, take ibuprofen as prescribed and apply fresh green cabbage leaves directly to your breasts inside the bra cups. Postpartum "BLUES": It is common to emotional days after delivery,  however if it persist for greater than 2 weeks or if you feel concerned please let your physician know immediately. This is hormone driven and nothing you can control so please let someone know how you feel. Follow Up Visit: Please schedule a follow up visit with your delivering provider  Call office if you have any of the following: headache, visual changes, fever >101.0 F, chills, breast concerns, excessive vaginal bleeding, incision drainage or problems, leg pain or redness, depression or any other concerns.  For concerns about your baby, please call your pediatrician For breastfeeding concerns, the lactation consultant can be reached at [phone]

## 2023-09-06 NOTE — Progress Notes (Signed)
 Patient discharged. Discharge instructions given. Patient verbalizes understanding. Transported by axillary.

## 2023-11-16 ENCOUNTER — Encounter: Payer: Self-pay | Admitting: Gastroenterology

## 2023-11-16 ENCOUNTER — Ambulatory Visit: Admitting: Certified Registered"

## 2023-11-16 ENCOUNTER — Encounter
Admission: RE | Disposition: A | Discharge status: Home / Self Care | Payer: Self-pay | Source: Home / Self Care | Attending: Gastroenterology

## 2023-11-16 ENCOUNTER — Ambulatory Visit
Admission: RE | Admit: 2023-11-16 | Discharge: 2023-11-16 | Disposition: A | Discharge status: Home / Self Care | Attending: Gastroenterology | Admitting: Gastroenterology

## 2023-11-16 ENCOUNTER — Other Ambulatory Visit: Payer: Self-pay

## 2023-11-16 DIAGNOSIS — K625 Hemorrhage of anus and rectum: Secondary | ICD-10-CM | POA: Insufficient documentation

## 2023-11-16 DIAGNOSIS — K5904 Chronic idiopathic constipation: Secondary | ICD-10-CM | POA: Diagnosis not present

## 2023-11-16 HISTORY — PX: COLONOSCOPY: SHX5424

## 2023-11-16 LAB — POCT PREGNANCY, URINE: Preg Test, Ur: NEGATIVE

## 2023-11-16 SURGERY — COLONOSCOPY
Anesthesia: General

## 2023-11-16 MED ORDER — LIDOCAINE HCL (PF) 2 % IJ SOLN
INTRAMUSCULAR | Status: DC | PRN
  Administered 2023-11-16: 100 mg via INTRADERMAL

## 2023-11-16 MED ORDER — PROPOFOL 10 MG/ML IV BOLUS
INTRAVENOUS | Status: AC
  Filled 2023-11-16: qty 20

## 2023-11-16 MED ORDER — LIDOCAINE HCL (PF) 2 % IJ SOLN
INTRAMUSCULAR | Status: AC
  Filled 2023-11-16: qty 5

## 2023-11-16 MED ORDER — PROPOFOL 500 MG/50ML IV EMUL
INTRAVENOUS | Status: DC | PRN
Start: 2023-11-16 — End: 2023-11-16
  Administered 2023-11-16: 120 mg via INTRAVENOUS
  Administered 2023-11-16: 180 ug/kg/min via INTRAVENOUS

## 2023-11-16 MED ORDER — GLYCOPYRROLATE 0.2 MG/ML IJ SOLN
INTRAMUSCULAR | Status: DC | PRN
  Administered 2023-11-16: .1 mg via INTRAVENOUS

## 2023-11-16 MED ORDER — LIDOCAINE HCL (PF) 2 % IJ SOLN
INTRAMUSCULAR | Status: AC
Start: 2023-11-16 — End: 2023-11-16
  Filled 2023-11-16: qty 5

## 2023-11-16 MED ORDER — DEXMEDETOMIDINE HCL IN NACL 80 MCG/20ML IV SOLN
INTRAVENOUS | Status: DC | PRN
Start: 2023-11-16 — End: 2023-11-16
  Administered 2023-11-16: 8 ug via INTRAVENOUS

## 2023-11-16 MED ORDER — GLYCOPYRROLATE 0.2 MG/ML IJ SOLN
INTRAMUSCULAR | Status: AC
  Filled 2023-11-16: qty 1

## 2023-11-16 MED ORDER — EPHEDRINE 5 MG/ML INJ
INTRAVENOUS | Status: AC
  Filled 2023-11-16: qty 5

## 2023-11-16 MED ORDER — PHENYLEPHRINE 80 MCG/ML (10ML) SYRINGE FOR IV PUSH (FOR BLOOD PRESSURE SUPPORT)
PREFILLED_SYRINGE | INTRAVENOUS | Status: AC
Start: 2023-11-16 — End: 2023-11-16
  Filled 2023-11-16: qty 10

## 2023-11-16 MED ORDER — EPHEDRINE SULFATE-NACL 50-0.9 MG/10ML-% IV SOSY
PREFILLED_SYRINGE | INTRAVENOUS | Status: DC | PRN
  Administered 2023-11-16: 5 mg via INTRAVENOUS

## 2023-11-16 MED ORDER — SODIUM CHLORIDE 0.9 % IV SOLN: INTRAVENOUS | Status: DC

## 2023-11-16 NOTE — H&P (Signed)
 Summer JONELLE Brooklyn, MD [facility], DHIP [address]  [place], [place] [address]  Main: [phone] Fax:  [phone] Pager: [phone]   Primary Care Physician:  Orlean Alan HERO, FNP Primary Gastroenterologist:  Dr. Corinn JONELLE Webb  Pre-Procedure History & Physical: HPI:  Summer Webb is a 23 y.o. female is here for an colonoscopy.   Past Medical History:  Diagnosis Date   ADD (attention deficit disorder)     Past Surgical History:  Procedure Laterality Date   CESAREAN SECTION N/A 09/04/2023   Procedure: CESAREAN DELIVERY;  Surgeon: Lovetta, Debby PARAS, MD;  Location: [facility] ORS;  Service: Obstetrics;  Laterality: N/A;   WISDOM TOOTH EXTRACTION      Prior to Admission medications   Medication Sig Start Date End Date Taking? Authorizing Provider  acetaminophen  (TYLENOL ) 500 MG tablet Take 2 tablets (1,000 mg total) by mouth every 6 (six) hours. 09/06/23  Yes Dickerson, Felicia, CNM  escitalopram  (LEXAPRO ) 10 MG tablet Take 1 tablet (10 mg total) by mouth at bedtime. 09/06/23 11/16/23 Yes Dickerson, Felicia, CNM  fexofenadine  (ALLEGRA ) 180 MG tablet Take 1 tablet (180 mg total) by mouth daily. 04/01/21  Yes Abernathy, Mardy, NP  fluticasone  (FLONASE ) 50 MCG/ACT nasal spray Place 2 sprays into both nostrils daily. 03/17/20  Yes Arloa Waddell RAMAN, NP  ibuprofen  (ADVIL ) 600 MG tablet Take 1 tablet (600 mg total) by mouth every 6 (six) hours. 09/06/23  Yes Dickerson, Felicia, CNM  albuterol  (VENTOLIN  HFA) 108 (90 Base) MCG/ACT inhaler Inhale 2 puffs into the lungs every 4 (four) hours as needed for wheezing or shortness of breath. Patient not taking: Reported on 11/16/2023 04/17/23   Orlean Alan HERO, FNP  coconut oil OIL Apply 1 Application topically as needed. Patient not taking: Reported on 11/16/2023 09/06/23   Dickerson, Felicia, CNM  dibucaine (NUPERCAINAL) 1 % OINT Place 1 Application rectally as needed for hemorrhoids. 09/06/23   Dickerson, Felicia,  CNM  enoxaparin  (LOVENOX ) 40 MG/0.4ML injection Inject 0.4 mLs (40 mg total) into the skin daily for 21 days. 09/07/23 09/28/23  Aisha Heller, CNM  famotidine-calcium  carbonate-magnesium hydroxide (PEPCID COMPLETE) 10-800-165 MG chewable tablet Chew 1 tablet by mouth daily as needed. Patient not taking: Reported on 11/16/2023    [provider]  ferrous sulfate  325 (65 FE) MG tablet Take 1 tablet (325 mg total) by mouth daily. Patient not taking: Reported on 11/16/2023 09/07/23   Aisha Heller, CNM  LINZESS  72 MCG capsule TAKE 1 CAPSULE(72 MCG) BY MOUTH DAILY BEFORE BREAKFAST Patient not taking: Reported on 11/16/2023 03/09/23   Orlean Alan HERO, FNP  oxyCODONE  (OXY IR/ROXICODONE ) 5 MG immediate release tablet Take 1-2 tablets (5-10 mg total) by mouth every 4 (four) hours as needed for moderate pain (pain score 4-6). Patient not taking: Reported on 11/16/2023 09/06/23   Aisha Heller, CNM  Prenatal Vit-Fe Fumarate-FA (PRENATAL MULTIVITAMIN) TABS tablet Take 1 tablet by mouth daily at 12 noon. Patient not taking: Reported on 11/16/2023    [provider]  senna-docusate (SENOKOT-S) 8.6-50 MG tablet Take 2 tablets by mouth daily. Patient not taking: Reported on 11/16/2023 09/07/23   Dickerson, Felicia, CNM  simethicone  (MYLICON) 80 MG chewable tablet Chew 1 tablet (80 mg total) by mouth as needed for flatulence. Patient not taking: Reported on 11/16/2023 09/06/23   Aisha Heller, CNM  witch hazel-glycerin  (TUCKS) pad Apply 1 Application topically as needed for hemorrhoids. 09/06/23   Dickerson, Felicia, CNM    Allergies as of 11/12/2023   (No  Known Allergies)    Family History  Problem Relation Age of Onset   Hypertension Mother    Diabetes Father        pre-diabetes    Social History   Socioeconomic History   Marital status: Single    Spouse name: Not on file   Number of children: Not on file   Years of education: Not on file   Highest education level: Not  on file  Occupational History   Not on file  Tobacco Use   Smoking status: Never   Smokeless tobacco: Never  Vaping Use   Vaping status: Never Used  Substance and Sexual Activity   Alcohol use: Yes    Comment: 2 x month    Drug use: Never   Sexual activity: Not on file  Other Topics Concern   Not on file  Social History Narrative   Not on file   Social Drivers of Health   Financial Resource Strain: Not on file  Food Insecurity: No Food Insecurity (09/03/2023)   Hunger Vital Sign    Worried About Running Out of Food in the Last Year: Never true    Ran Out of Food in the Last Year: Never true  Transportation Needs: No Transportation Needs (09/03/2023)   PRAPARE - Administrator, Civil Service (Medical): No    Lack of Transportation (Non-Medical): No  Physical Activity: Not on file  Stress: Not on file  Social Connections: Socially Integrated (09/03/2023)   Social Connection and Isolation Panel    Frequency of Communication with Friends and Family: Three times a week    Frequency of Social Gatherings with Friends and Family: Three times a week    Attends Religious Services: More than 4 times per year    Active Member of Clubs or Organizations: No    Attends Banker Meetings: More than 4 times per year    Marital Status: Married  Catering manager Violence: Not At Risk (09/03/2023)   Humiliation, Afraid, Rape, and Kick questionnaire    Fear of Current or Ex-Partner: No    Emotionally Abused: No    Physically Abused: No    Sexually Abused: No    Review of Systems: See HPI, otherwise negative ROS  Physical Exam: BP 130/80   Pulse 80   Temp (!) 96.3 F (35.7 C) (Temporal)   Resp 18   Ht 5' 6 (1.676 m)   Wt 87.1 kg   SpO2 99%   BMI 30.99 kg/m  General:   Alert,  pleasant and cooperative in NAD Head:  Normocephalic and atraumatic. Neck:  Supple; no masses or thyromegaly. Lungs:  Clear throughout to auscultation.    Heart:  Regular rate and  rhythm. Abdomen:  Soft, nontender and nondistended. Normal bowel sounds, without guarding, and without rebound.   Neurologic:  Alert and  oriented x4;  grossly normal neurologically.  Impression/Plan: [name] is here for an colonoscopy to be performed for rectal bleeding  Risks, benefits, limitations, and alternatives regarding  colonoscopy have been reviewed with the patient.  Questions have been answered.  All parties agreeable.   Summer Brooklyn, MD  11/16/2023, 10:17 AM

## 2023-11-16 NOTE — Transfer of Care (Signed)
 Immediate Anesthesia Transfer of Care Note  Patient: Summer Webb  Procedure(s) Performed: COLONOSCOPY  Patient Location: Endoscopy Unit  Anesthesia Type:General  Level of Consciousness: drowsy  Airway & Oxygen Therapy: Patient Spontanous Breathing  Post-op Assessment: Report given to RN and Post -op Vital signs reviewed and stable  Post vital signs: Reviewed and stable  Last Vitals:  Vitals Value Taken Time  BP 105/61 11/16/23 10:40  Temp 35.8 1040  Pulse 57 11/16/23 10:40  Resp 19 11/16/23 10:40  SpO2 98 % 11/16/23 10:40  Vitals shown include unfiled device data.  Last Pain:  Vitals:   11/16/23 0944  TempSrc: Temporal         Complications: No notable events documented.

## 2023-11-16 NOTE — Anesthesia Preprocedure Evaluation (Signed)
 Anesthesia Evaluation  Patient identified by MRN, date of birth, ID band Patient awake    Reviewed: Allergy & Precautions, NPO status , Patient's Chart, lab work & pertinent test results  History of Anesthesia Complications Negative for: history of anesthetic complications  Airway Mallampati: III  TM Distance: >3 FB Neck ROM: full    Dental  (+) Chipped   Pulmonary neg pulmonary ROS, neg shortness of breath   Pulmonary exam normal        Cardiovascular Exercise Tolerance: Good hypertension, Normal cardiovascular exam     Neuro/Psych  PSYCHIATRIC DISORDERS      negative neurological ROS     GI/Hepatic negative GI ROS, Neg liver ROS,neg GERD  ,,  Endo/Other  negative endocrine ROS    Renal/GU negative Renal ROS  negative genitourinary   Musculoskeletal   Abdominal   Peds  Hematology negative hematology ROS (+)   Anesthesia Other Findings Past Medical History: No date: ADD (attention deficit disorder)  Past Surgical History: 09/04/2023: CESAREAN SECTION; N/A     Comment:  Procedure: CESAREAN DELIVERY;  Surgeon: Lovetta Debby PARAS, MD;  Location: [facility] ORS;  Service: Obstetrics;               Laterality: N/A; No date: WISDOM TOOTH EXTRACTION  BMI    Body Mass Index: 30.99 kg/m      Reproductive/Obstetrics negative OB ROS                              Anesthesia Physical Anesthesia Plan  ASA: 2  Anesthesia Plan: General   Post-op Pain Management:    Induction: Intravenous  PONV Risk Score and Plan: Propofol infusion and TIVA  Airway Management Planned: Natural Airway and Nasal Cannula  Additional Equipment:   Intra-op Plan:   Post-operative Plan:   Informed Consent: I have reviewed the patients History and Physical, chart, labs and discussed the procedure including the risks, benefits and alternatives for the proposed anesthesia with the patient or  authorized representative who has indicated his/her understanding and acceptance.     Dental Advisory Given  Plan Discussed with: Anesthesiologist, CRNA and Surgeon  Anesthesia Plan Comments: (Patient consented for risks of anesthesia including but not limited to:  - adverse reactions to medications - risk of airway placement if required - damage to eyes, teeth, lips or other oral mucosa - nerve damage due to positioning  - sore throat or hoarseness - Damage to heart, brain, nerves, lungs, other parts of body or loss of life  Patient voiced understanding and assent.)        Anesthesia Quick Evaluation

## 2023-11-16 NOTE — Anesthesia Postprocedure Evaluation (Signed)
 Anesthesia Post Note  Patient: Summer Webb  Procedure(s) Performed: COLONOSCOPY  Patient location during evaluation: Endoscopy Anesthesia Type: General Level of consciousness: awake and alert Pain management: pain level controlled Vital Signs Assessment: post-procedure vital signs reviewed and stable Respiratory status: spontaneous breathing, nonlabored ventilation and respiratory function stable Cardiovascular status: blood pressure returned to baseline and stable Postop Assessment: no apparent nausea or vomiting Anesthetic complications: no   No notable events documented.   Last Vitals:  Vitals:   11/16/23 1055 11/16/23 1111  BP: 133/86 132/65  Pulse: 72   Resp:    Temp:    SpO2: 99%     Last Pain:  Vitals:   11/16/23 1055  TempSrc:   PainSc: 0-No pain                 Fairy POUR Chandlar Guice

## 2023-11-16 NOTE — Op Note (Addendum)
 [facility] Patient Name: Summer Webb Procedure Date: 11/16/2023 10:02 AM MRN: [id number] Account #: [id number] Date of Birth: 2000-06-17 Admit Type: Outpatient Age: 23 Room: [facility] ENDO ROOM 4 Gender: Female Note Status: Supervisor Override Instrument Name: Peds Colonoscope [id number] Procedure:             Colonoscopy Indications:           This is the patient's first colonoscopy, Rectal                         bleeding, Chronic idiopathic constipation Providers:             Corinn Jess Brooklyn MD, MD Referring MD:          No Local Md, MD (Referring MD) Medicines:             General Anesthesia Complications:         No immediate complications. Estimated blood loss: None. Procedure:             Pre-Anesthesia Assessment:                        - Prior to the procedure, a History and Physical was                         performed, and patient medications and allergies were                         reviewed. The patient is competent. The risks and                         benefits of the procedure and the sedation options and                         risks were discussed with the patient. All questions                         were answered and informed consent was obtained.                         Patient identification and proposed procedure were                         verified by the physician, the nurse, the                         anesthesiologist, the anesthetist and the technician                         in the pre-procedure area in the procedure room in the                         endoscopy suite. Mental Status Examination: alert and                         oriented. Airway Examination: normal oropharyngeal                         airway and neck mobility. Respiratory Examination:  clear to auscultation. CV Examination: normal.                         Prophylactic Antibiotics: The patient does not require                          prophylactic antibiotics. Prior Anticoagulants: The                         patient has taken no anticoagulant or antiplatelet                         agents. ASA Grade Assessment: II - A patient with mild                         systemic disease. After reviewing the risks and                         benefits, the patient was deemed in satisfactory                         condition to undergo the procedure. The anesthesia                         plan was to use general anesthesia. Immediately prior                         to administration of medications, the patient was                         re-assessed for adequacy to receive sedatives. The                         heart rate, respiratory rate, oxygen saturations,                         blood pressure, adequacy of pulmonary ventilation, and                         response to care were monitored throughout the                         procedure. The physical status of the patient was                         re-assessed after the procedure.                        After obtaining informed consent, the colonoscope was                         passed under direct vision. Throughout the procedure,                         the patient's blood pressure, pulse, and oxygen                         saturations were monitored continuously. The  Colonoscope was introduced through the anus and                         advanced to the the terminal ileum, with                         identification of the appendiceal orifice and IC                         valve. The colonoscopy was performed without                         difficulty. The patient tolerated the procedure well.                         The quality of the bowel preparation was evaluated                         using the BBPS [facility] Bowel Preparation Scale) with                         scores of: Right Colon = 3, Transverse Colon = 3 and                         Left  Colon = 3 (entire mucosa seen well with no                         residual staining, small fragments of stool or opaque                         liquid). The total BBPS score equals 9. The terminal                         ileum, ileocecal valve, appendiceal orifice, and                         rectum were photographed. Findings:      The perianal and digital rectal examinations were normal. Pertinent       negatives include normal sphincter tone and no palpable rectal lesions.      The terminal ileum appeared normal.      The entire examined colon appeared normal.      The retroflexed view of the distal rectum and anal verge was normal and       showed no anal or rectal abnormalities. Impression:            - The examined portion of the ileum was normal.                        - The entire examined colon is normal.                        - The distal rectum and anal verge are normal on                         retroflexion view.                        -  No specimens collected. Recommendation:        - Discharge patient to home (with escort).                        - Resume previous diet today.                        - Continue present medications. Procedure Code(s):     --- Professional ---                        [phone], Colonoscopy, flexible; diagnostic, including                         collection of specimen(s) by brushing or washing, when                         performed (separate procedure) Diagnosis Code(s):     --- Professional ---                        K62.5, Hemorrhage of anus and rectum CPT copyright 2022 American Medical Association. All rights reserved. The codes documented in this report are preliminary and upon coder review may  be revised to meet current compliance requirements. Dr. Corinn Brooklyn Corinn Jess Brooklyn MD, MD 11/16/2023 10:36:42 AM This report has been signed electronically. Number of Addenda: 0 Note Initiated On: 11/16/2023 10:02 AM Scope Withdrawal Time:  0 hours 7 minutes 5 seconds  Total Procedure Duration: 0 hours 10 minutes 11 seconds  Estimated Blood Loss:  Estimated blood loss: none.      [facility]

## 2023-11-22 ENCOUNTER — Encounter: Payer: Self-pay | Admitting: Family

## 2023-11-22 NOTE — Progress Notes (Signed)
 Acute Office Visit  Subjective:     Patient ID: Summer Webb, female    DOB: [birth year], 23 y.o.   MRN: [id number]  Patient is in today for  Chief Complaint  Patient presents with   Acute Visit    Dark spot on chin    Patient is here today to discuss concerns with her skin.  She is pregnant, and she has a darker brown spot on her chin.  She has been monitoring it for a while, but is just asking if she needs to do anything about it.   Has a follow up with her OB/GYN today.       Review of Systems  All other systems reviewed and are negative.       Objective:    BP 115/62   Pulse 96   Ht 5' 6 (1.676 m)   Wt 206 lb 3.2 oz (93.5 kg)   SpO2 90%   BMI 33.28 kg/m   Physical Exam Vitals and nursing note reviewed.  Constitutional:      Appearance: Normal appearance. She is normal weight.  HENT:     Head: Normocephalic.  Eyes:     Extraocular Movements: Extraocular movements intact.     Conjunctiva/sclera: Conjunctivae normal.     Pupils: Pupils are equal, round, and reactive to light.  Cardiovascular:     Rate and Rhythm: Normal rate.  Pulmonary:     Effort: Pulmonary effort is normal.  Neurological:     General: No focal deficit present.     Mental Status: She is alert and oriented to person, place, and time. Mental status is at baseline.  Psychiatric:        Mood and Affect: Mood normal.        Behavior: Behavior normal.        Thought Content: Thought content normal.        Judgment: Judgment normal.     No results found for any visits on 08/24/23.  Recent Results (from the past 2160 hours)  CBC     Status: Abnormal   Collection Time: 09/03/23 12:59 AM  Result Value Ref Range   WBC 10.4 4.0 - 10.5 K/uL   RBC 3.50 (L) 3.87 - 5.11 MIL/uL   Hemoglobin 10.9 (L) 12.0 - 15.0 g/dL   HCT 67.7 (L) 63.9 - 53.9 %   MCV 92.0 80.0 - 100.0 fL   MCH 31.1 26.0 - 34.0 pg   MCHC 33.9 30.0 - 36.0 g/dL   RDW 86.9 88.4 - 84.4 %   Platelets 224 150 - 400 K/uL    nRBC 0.0 0.0 - 0.2 %    Comment: Performed at [facility], [address] [place], [place] [address]  Type and screen     Status: None   Collection Time: 09/03/23 12:59 AM  Result Value Ref Range   ABO/RH(D) A POS    Antibody Screen NEG    Sample Expiration      09/06/2023,2359 Performed at [facility], [address] [place], [place] [address]   RPR     Status: None   Collection Time: 09/03/23 12:59 AM  Result Value Ref Range   RPR Ser Ql NON REACTIVE NON REACTIVE    Comment: Performed at [facility], [address], [place], [place] [address]  ABO/Rh     Status: None   Collection Time: 09/03/23  6:11 AM  Result Value Ref Range   ABO/RH(D)  A POS Performed at [facility], [address] [place], [place] [address]   CBC     Status: Abnormal   Collection Time: 09/04/23 11:08 AM  Result Value Ref Range   WBC 17.7 (H) 4.0 - 10.5 K/uL   RBC 3.51 (L) 3.87 - 5.11 MIL/uL   Hemoglobin 10.8 (L) 12.0 - 15.0 g/dL   HCT 67.2 (L) 63.9 - 53.9 %   MCV 93.2 80.0 - 100.0 fL   MCH 30.8 26.0 - 34.0 pg   MCHC 33.0 30.0 - 36.0 g/dL   RDW 86.7 88.4 - 84.4 %   Platelets 212 150 - 400 K/uL   nRBC 0.0 0.0 - 0.2 %    Comment: Performed at [facility], [address] [place], [place] [address]  Creatinine, serum     Status: None   Collection Time: 09/04/23 11:08 AM  Result Value Ref Range   Creatinine, Ser 0.59 0.44 - 1.00 mg/dL   GFR, Estimated >39 >39 mL/min    Comment: (NOTE) Calculated using the CKD-EPI Creatinine Equation (2021) Performed at [facility], [address] [place], [place] [address]   CBC     Status: Abnormal   Collection Time: 09/05/23  2:14 AM  Result Value Ref Range   WBC 14.6 (H) 4.0 - 10.5 K/uL   RBC 2.86 (L) 3.87 - 5.11 MIL/uL   Hemoglobin 9.1 (L) 12.0 - 15.0 g/dL   HCT 73.0 (L) 63.9 - 53.9 %   MCV 94.1 80.0 - 100.0 fL   MCH 31.8 26.0 - 34.0 pg   MCHC 33.8 30.0 - 36.0 g/dL   RDW 86.5 88.4 -  84.4 %   Platelets 170 150 - 400 K/uL   nRBC 0.0 0.0 - 0.2 %    Comment: Performed at [facility], [address] [place], [place] [address]  Pregnancy, urine POC     Status: None   Collection Time: 11/16/23  9:34 AM  Result Value Ref Range   Preg Test, Ur NEGATIVE NEGATIVE    Comment:        THE SENSITIVITY OF THIS METHODOLOGY IS >20 mIU/mL.     Allergies as of 08/24/2023   No Known Allergies      Medication List        Accurate as of Aug 24, 2023 11:59 PM. If you have any questions, ask your nurse or doctor.          albuterol  108 (90 Base) MCG/ACT inhaler Commonly known as: VENTOLIN  HFA Inhale 2 puffs into the lungs every 4 (four) hours as needed for wheezing or shortness of breath.   famotidine-calcium  carbonate-magnesium hydroxide 10-800-165 MG chewable tablet Commonly known as: PEPCID COMPLETE Chew 1 tablet by mouth daily as needed.   fexofenadine  180 MG tablet Commonly known as: ALLEGRA  Take 1 tablet (180 mg total) by mouth daily.   fluticasone  50 MCG/ACT nasal spray Commonly known as: FLONASE  Place 2 sprays into both nostrils daily.   guaiFENesin  600 MG 12 hr tablet Commonly known as: Mucinex  Take 1 tablet (600 mg total) by mouth 2 (two) times daily.   ibuprofen  800 MG tablet Commonly known as: ADVIL  Take 1 tablet (800 mg total) by mouth every 8 (eight) hours as needed (pain).   Linzess  72 MCG capsule Generic drug: linaclotide  TAKE 1 CAPSULE(72 MCG) BY MOUTH DAILY BEFORE BREAKFAST   montelukast  10 MG tablet Commonly known as: SINGULAIR  Take 1 tablet (10 mg total) by mouth at bedtime.   prenatal  multivitamin Tabs tablet Take 1 tablet by mouth daily at 12 noon.   senna-docusate 8.6-50 MG tablet Commonly known as: Senokot-S Take 1 tablet by mouth daily.            Assessment & Plan Hyperpigmentation of skin Reassured pt that this is likely a change due to her pregnancy.  Will reassess as needed at follow up.    No follow-ups  on file.  Total time spent: 20 minutes  Summer CHRISTELLA ARRANT, FNP  08/24/2023   This document may have been prepared by [facility] Voice Recognition software and as such may include unintentional dictation errors.

## 2024-01-02 ENCOUNTER — Encounter: Payer: Self-pay | Admitting: Family

## 2024-01-03 ENCOUNTER — Ambulatory Visit (INDEPENDENT_AMBULATORY_CARE_PROVIDER_SITE_OTHER): Admitting: Family

## 2024-01-03 DIAGNOSIS — J069 Acute upper respiratory infection, unspecified: Secondary | ICD-10-CM | POA: Diagnosis not present

## 2024-01-03 LAB — POCT XPERT XPRESS SARS COVID-2/FLU/RSV
FLU A: NEGATIVE
FLU B: NEGATIVE
RSV RNA, PCR: NEGATIVE
SARS Coronavirus 2: NEGATIVE

## 2024-01-03 NOTE — Progress Notes (Signed)
   Subjective   CHIEF COMPLAINT  COVID Testing    REASON FOR VISIT  COVID testing for URI symptoms.        Objective   Results for orders placed or performed in visit on 01/03/24  POCT XPERT XPRESS SARS COVID-2/FLU/RSV  Result Value Ref Range   SARS Coronavirus 2 Negative    FLU A Negative    FLU B Negative    RSV RNA, PCR Negative     Assessment & Plan  1. Acute upper respiratory infection (Primary) - POCT XPERT XPRESS SARS COVID-2/FLU/RSV   Total time spent: 5 minutes  ALAN CHRISTELLA ARRANT, FNP 01/03/2024

## 2024-01-11 ENCOUNTER — Ambulatory Visit: Payer: Self-pay | Admitting: Family

## 2024-01-16 ENCOUNTER — Encounter: Payer: Self-pay | Admitting: Family

## 2024-03-21 ENCOUNTER — Ambulatory Visit: Admitting: Family

## 2024-04-04 ENCOUNTER — Ambulatory Visit: Admitting: Family

## 2024-04-25 ENCOUNTER — Encounter: Payer: Self-pay | Admitting: Family

## 2024-04-25 ENCOUNTER — Ambulatory Visit: Admitting: Family

## 2024-04-25 VITALS — BP 122/84 | HR 94 | Ht 66.0 in | Wt 206.6 lb

## 2024-04-25 DIAGNOSIS — E66811 Obesity, class 1: Secondary | ICD-10-CM

## 2024-04-25 DIAGNOSIS — R1012 Left upper quadrant pain: Secondary | ICD-10-CM

## 2024-04-25 DIAGNOSIS — R300 Dysuria: Secondary | ICD-10-CM

## 2024-04-25 LAB — POCT URINALYSIS DIPSTICK
Bilirubin, UA: NEGATIVE
Blood, UA: NEGATIVE
Glucose, UA: NEGATIVE
Ketones, UA: NEGATIVE
Nitrite, UA: NEGATIVE
Protein, UA: NEGATIVE
Spec Grav, UA: 1.015
Urobilinogen, UA: 0.2 U/dL
pH, UA: 7.5

## 2024-04-26 LAB — URINALYSIS, ROUTINE W REFLEX MICROSCOPIC
Bilirubin, UA: NEGATIVE
Glucose, UA: NEGATIVE
Ketones, UA: NEGATIVE
Nitrite, UA: NEGATIVE
Protein,UA: NEGATIVE
RBC, UA: NEGATIVE
Specific Gravity, UA: 1.008 (ref 1.005–1.030)
Urobilinogen, Ur: 0.2 mg/dL (ref 0.2–1.0)
pH, UA: 8 — ABNORMAL HIGH (ref 5.0–7.5)

## 2024-04-26 LAB — MICROSCOPIC EXAMINATION
Bacteria, UA: NONE SEEN
Casts: NONE SEEN /LPF
Epithelial Cells (non renal): NONE SEEN /HPF (ref 0–10)
RBC, Urine: NONE SEEN /HPF (ref 0–2)
WBC, UA: NONE SEEN /HPF (ref 0–5)

## 2024-04-27 LAB — URINE CULTURE

## 2024-04-27 NOTE — Progress Notes (Unsigned)
 "  Established Patient Office Visit  Subjective:  Patient ID: Summer Webb, female    DOB: 09/28/2000  Age: 24 y.o. MRN: [id number]  Chief Complaint  Patient presents with   Acute Visit    Left side pain under breast    Patient here with concerns regarding her pain in her abdomen.  She has been having pain on the left upper quadrant, this pain has been intermittent for about a year, she says that she     No other concerns at this time.   Past Medical History:  Diagnosis Date   ADD (attention deficit disorder)     Past Surgical History:  Procedure Laterality Date   CESAREAN SECTION N/A 09/04/2023   Procedure: CESAREAN DELIVERY;  Surgeon: Lovetta, Debby PARAS, MD;  Location: [facility] ORS;  Service: Obstetrics;  Laterality: N/A;   COLONOSCOPY N/A 11/16/2023   Procedure: COLONOSCOPY;  Surgeon: Unk Corinn Skiff, MD;  Location: [facility] ENDOSCOPY;  Service: Gastroenterology;  Laterality: N/A;   WISDOM TOOTH EXTRACTION      Social History   Socioeconomic History   Marital status: Single    Spouse name: Not on file   Number of children: Not on file   Years of education: Not on file   Highest education level: Not on file  Occupational History   Not on file  Tobacco Use   Smoking status: Never   Smokeless tobacco: Never  Vaping Use   Vaping status: Never Used  Substance and Sexual Activity   Alcohol use: Yes    Comment: 2 x month    Drug use: Never   Sexual activity: Not on file  Other Topics Concern   Not on file  Social History Narrative   Not on file   Social Drivers of Health   Tobacco Use: Low Risk (04/25/2024)   Patient History    Smoking Tobacco Use: Never    Smokeless Tobacco Use: Never    Passive Exposure: Not on file  Financial Resource Strain: Not on file  Food Insecurity: No Food Insecurity (09/03/2023)   Hunger Vital Sign    Worried About Running Out of Food in the Last Year: Never true    Ran Out of Food in the Last Year: Never  true  Transportation Needs: No Transportation Needs (09/03/2023)   PRAPARE - Administrator, Civil Service (Medical): No    Lack of Transportation (Non-Medical): No  Physical Activity: Not on file  Stress: Not on file  Social Connections: Socially Integrated (09/03/2023)   Social Connection and Isolation Panel    Frequency of Communication with Friends and Family: Three times a week    Frequency of Social Gatherings with Friends and Family: Three times a week    Attends Religious Services: More than 4 times per year    Active Member of Clubs or Organizations: No    Attends Banker Meetings: More than 4 times per year    Marital Status: Married  Catering Manager Violence: Not At Risk (09/03/2023)   Humiliation, Afraid, Rape, and Kick questionnaire    Fear of Current or Ex-Partner: No    Emotionally Abused: No    Physically Abused: No    Sexually Abused: No  Depression (PHQ2-9): Not on file  Alcohol Screen: Not on file  Housing: Low Risk (09/03/2023)   Housing Stability Vital Sign    Unable to Pay for Housing in the Last Year: No    Number of Times Moved in the Last Year:  0    Homeless in the Last Year: No  Utilities: Not At Risk (09/03/2023)   [employer] Utilities    Threatened with loss of utilities: No  Health Literacy: Not on file    Family History  Problem Relation Age of Onset   Hypertension Mother    Diabetes Father        pre-diabetes    Allergies[1]  Review of Systems  All other systems reviewed and are negative.      Objective:   BP 122/84   Pulse 94   Ht 5' 6 (1.676 m)   Wt 206 lb 9.6 oz (93.7 kg)   SpO2 98%   BMI 33.35 kg/m   Vitals:   04/25/24 1329  BP: 122/84  Pulse: 94  Height: 5' 6 (1.676 m)  Weight: 206 lb 9.6 oz (93.7 kg)  SpO2: 98%  BMI (Calculated): 33.36    Physical Exam Vitals and nursing note reviewed.  Constitutional:      Appearance: Normal appearance. She is normal weight.  HENT:      Head: Normocephalic.  Eyes:     Extraocular Movements: Extraocular movements intact.     Conjunctiva/sclera: Conjunctivae normal.     Pupils: Pupils are equal, round, and reactive to light.  Cardiovascular:     Rate and Rhythm: Normal rate.  Pulmonary:     Effort: Pulmonary effort is normal.  Neurological:     General: No focal deficit present.     Mental Status: She is alert and oriented to person, place, and time. Mental status is at baseline.  Psychiatric:        Mood and Affect: Mood normal.        Behavior: Behavior normal.        Thought Content: Thought content normal.      Results for orders placed or performed in visit on 04/25/24  Urine Culture   Specimen: Urine, Clean Catch   UR  Result Value Ref Range   Urine Culture, Routine Final report (A)    Organism ID, Bacteria Comment (A)    ORGANISM ID, BACTERIA Comment   Microscopic Examination  Result Value Ref Range   WBC, UA None seen 0 - 5 /hpf   RBC, Urine None seen 0 - 2 /hpf   Epithelial Cells (non renal) None seen 0 - 10 /hpf   Casts None seen None seen /lpf   Bacteria, UA None seen None seen/Few  Urinalysis, Routine w reflex microscopic  Result Value Ref Range   Specific Gravity, UA 1.008 1.005 - 1.030   pH, UA 8.0 (H) 5.0 - 7.5   Color, UA Yellow Yellow   Appearance Ur Clear Clear   Leukocytes,UA Trace (A) Negative   Protein,UA Negative Negative/Trace   Glucose, UA Negative Negative   Ketones, UA Negative Negative   RBC, UA Negative Negative   Bilirubin, UA Negative Negative   Urobilinogen, Ur 0.2 0.2 - 1.0 mg/dL   Nitrite, UA Negative Negative   Microscopic Examination See below:   POCT Urinalysis Dipstick (18997)  Result Value Ref Range   Color, UA Yellow    Clarity, UA Clear    Glucose, UA Negative Negative   Bilirubin, UA Negative    Ketones, UA Negative    Spec Grav, UA 1.015 1.010 - 1.025   Blood, UA Negative    pH, UA 7.5 5.0 - 8.0   Protein, UA Negative Negative   Urobilinogen, UA 0.2  0.2 or 1.0 E.U./dL   Nitrite, UA Negative  Leukocytes, UA Small (1+) (A) Negative   Appearance Clear    Odor Yes     Recent Results (from the past 2160 hours)  POCT Urinalysis Dipstick (18997)     Status: Abnormal   Collection Time: 04/25/24  1:48 PM  Result Value Ref Range   Color, UA Yellow    Clarity, UA Clear    Glucose, UA Negative Negative   Bilirubin, UA Negative    Ketones, UA Negative    Spec Grav, UA 1.015 1.010 - 1.025   Blood, UA Negative    pH, UA 7.5 5.0 - 8.0   Protein, UA Negative Negative   Urobilinogen, UA 0.2 0.2 or 1.0 E.U./dL   Nitrite, UA Negative    Leukocytes, UA Small (1+) (A) Negative   Appearance Clear    Odor Yes   Urinalysis, Routine w reflex microscopic     Status: Abnormal   Collection Time: 04/25/24  2:39 PM  Result Value Ref Range   Specific Gravity, UA 1.008 1.005 - 1.030   pH, UA 8.0 (H) 5.0 - 7.5   Color, UA Yellow Yellow   Appearance Ur Clear Clear   Leukocytes,UA Trace (A) Negative   Protein,UA Negative Negative/Trace   Glucose, UA Negative Negative   Ketones, UA Negative Negative   RBC, UA Negative Negative   Bilirubin, UA Negative Negative   Urobilinogen, Ur 0.2 0.2 - 1.0 mg/dL   Nitrite, UA Negative Negative   Microscopic Examination See below:     Comment: Microscopic was indicated and was performed.  Microscopic Examination     Status: None   Collection Time: 04/25/24  2:39 PM  Result Value Ref Range   WBC, UA None seen 0 - 5 /hpf   RBC, Urine None seen 0 - 2 /hpf   Epithelial Cells (non renal) None seen 0 - 10 /hpf   Casts None seen None seen /lpf   Bacteria, UA None seen None seen/Few  Urine Culture     Status: Abnormal   Collection Time: 04/25/24  2:41 PM   Specimen: Urine, Clean Catch   UR  Result Value Ref Range   Urine Culture, Routine Final report (A)    Organism ID, Bacteria Comment (A)     Comment: Beta hemolytic Streptococcus, group B 10,000-25,000 colony forming units per mL Penicillin  and ampicillin  are drugs of choice for treatment of beta-hemolytic streptococcal infections. Susceptibility testing of penicillins and other beta-lactam agents approved by the FDA for treatment of beta-hemolytic streptococcal infections need not be performed routinely because nonsusceptible isolates are extremely rare in any beta-hemolytic streptococcus and have not been reported for Streptococcus pyogenes (group A). (CLSI)    ORGANISM ID, BACTERIA Comment     Comment: Mixed urogenital flora 10,000-25,000 colony forming units per mL        Assessment & Plan:   Assessment & Plan Dysuria  LUQ pain  Obesity (BMI 30.0-34.9)     No follow-ups on file.   Total time spent: {AMA time spent:29001} minutes  ALAN CHRISTELLA ARRANT, FNP  04/25/2024   This document may have been prepared by [facility] Voice Recognition software and as such may include unintentional dictation errors.       [1] No Known Allergies "

## 2024-04-28 ENCOUNTER — Ambulatory Visit: Payer: Self-pay

## 2024-04-28 ENCOUNTER — Other Ambulatory Visit: Payer: Self-pay

## 2024-04-28 MED ORDER — AMOXICILLIN-POT CLAVULANATE 875-125 MG PO TABS: 1.0000 | ORAL_TABLET | Freq: Two times a day (BID) | ORAL | 0 refills | Status: AC

## 2024-04-28 MED ORDER — FLUCONAZOLE 150 MG PO TABS: 150.0000 mg | ORAL_TABLET | Freq: Every day | ORAL | 0 refills | Status: AC

## 2024-05-07 ENCOUNTER — Other Ambulatory Visit

## 2024-05-30 ENCOUNTER — Ambulatory Visit: Admitting: Family

## 2024-05-30 ENCOUNTER — Encounter: Payer: Self-pay | Admitting: Family
